# Patient Record
Sex: Female | Born: 1975 | Race: White | Hispanic: No | Smoking: Former smoker
Health system: Southern US, Community
[De-identification: ages and names within clinical notes are randomized; demographics above are authoritative.]

## PROBLEM LIST (undated history)

## (undated) DIAGNOSIS — G43909 Migraine, unspecified, not intractable, without status migrainosus: Secondary | ICD-10-CM

## (undated) DIAGNOSIS — I10 Essential (primary) hypertension: Secondary | ICD-10-CM

## (undated) DIAGNOSIS — N959 Unspecified menopausal and perimenopausal disorder: Secondary | ICD-10-CM

## (undated) HISTORY — DX: Unspecified menopausal and perimenopausal disorder: N95.9

## (undated) HISTORY — DX: Essential (primary) hypertension: I10

## (undated) HISTORY — DX: Migraine, unspecified, not intractable, without status migrainosus: G43.909

---

## 2006-06-07 ENCOUNTER — Emergency Department: Payer: Self-pay | Admitting: Emergency Medicine

## 2007-04-14 ENCOUNTER — Ambulatory Visit: Payer: Self-pay | Admitting: Internal Medicine

## 2008-03-22 ENCOUNTER — Observation Stay: Payer: Self-pay

## 2008-03-27 ENCOUNTER — Inpatient Hospital Stay: Payer: Self-pay | Admitting: Obstetrics and Gynecology

## 2008-11-16 ENCOUNTER — Ambulatory Visit: Payer: Self-pay | Admitting: Internal Medicine

## 2008-11-29 ENCOUNTER — Ambulatory Visit: Payer: Self-pay | Admitting: Internal Medicine

## 2010-09-08 ENCOUNTER — Ambulatory Visit: Payer: Self-pay | Admitting: Internal Medicine

## 2010-11-02 ENCOUNTER — Ambulatory Visit: Payer: Self-pay | Admitting: Obstetrics and Gynecology

## 2011-06-04 ENCOUNTER — Ambulatory Visit: Payer: Self-pay

## 2011-06-04 LAB — HCG, QUANTITATIVE, PREGNANCY: Beta Hcg, Quant.: <1 m[IU]/mL — ABNORMAL LOW

## 2016-01-24 ENCOUNTER — Other Ambulatory Visit: Payer: Self-pay | Admitting: Nurse Practitioner

## 2016-01-24 DIAGNOSIS — E229 Hyperfunction of pituitary gland, unspecified: Secondary | ICD-10-CM

## 2016-01-25 ENCOUNTER — Ambulatory Visit
Admission: RE | Admit: 2016-01-25 | Discharge: 2016-01-25 | Disposition: A | Discharge status: Home / Self Care | Payer: 59 | Source: Ambulatory Visit | Attending: Nurse Practitioner | Admitting: Nurse Practitioner

## 2016-01-25 ENCOUNTER — Encounter: Payer: Self-pay | Admitting: Radiology

## 2016-01-25 DIAGNOSIS — J320 Chronic maxillary sinusitis: Secondary | ICD-10-CM | POA: Insufficient documentation

## 2016-01-25 DIAGNOSIS — E229 Hyperfunction of pituitary gland, unspecified: Secondary | ICD-10-CM | POA: Insufficient documentation

## 2016-01-25 MED ORDER — GADOBENATE DIMEGLUMINE 529 MG/ML IV SOLN
10.0000 mL | Freq: Once | INTRAVENOUS | Status: AC | PRN
  Administered 2016-01-25: 10 mL via INTRAVENOUS

## 2017-04-30 ENCOUNTER — Other Ambulatory Visit: Payer: Self-pay

## 2017-04-30 DIAGNOSIS — D352 Benign neoplasm of pituitary gland: Secondary | ICD-10-CM | POA: Insufficient documentation

## 2017-04-30 MED ORDER — ELETRIPTAN HYDROBROMIDE 40 MG PO TABS: ORAL_TABLET | ORAL | 2 refills | Status: DC

## 2017-08-22 ENCOUNTER — Ambulatory Visit: Payer: Managed Care, Other (non HMO) | Admitting: Internal Medicine

## 2017-08-22 ENCOUNTER — Encounter: Payer: Self-pay | Admitting: Internal Medicine

## 2017-08-22 VITALS — BP 128/82 | HR 99 | Temp 98.3°F | Resp 16 | Ht 65.0 in | Wt 219.6 lb

## 2017-08-22 DIAGNOSIS — J45909 Unspecified asthma, uncomplicated: Secondary | ICD-10-CM

## 2017-08-22 MED ORDER — HYDROCOD POLST-CPM POLST ER 10-8 MG/5ML PO SUER: 5.0000 mL | Freq: Two times a day (BID) | ORAL | 0 refills | Status: DC | PRN

## 2017-08-22 MED ORDER — AMOXICILLIN-POT CLAVULANATE 875-125 MG PO TABS: 1.0000 | ORAL_TABLET | Freq: Two times a day (BID) | ORAL | 0 refills | Status: DC

## 2017-08-22 NOTE — Progress Notes (Signed)
  [facility] [place], [place] [address]  Internal MEDICINE  Office Visit Note  Patient Name: Barbara Stephens  [id number]  [id number]  Date of Service: 08/26/2017  Chief Complaint  Patient presents with  . Sinusitis  . Cough  . Laryngitis     HPI Pt is here for a sick visit. Pt is unable to talk due to hoarseness. She is unable to rest at night due to cough. Postnasal drip is present    Current Medication:  Outpatient Encounter Medications as of 08/22/2017  Medication Sig  . eletriptan (RELPAX) 40 MG tablet Take 1 tab at onset of migraine May repeat in 2 hours if headache persists or recurs.  . SUMAtriptan (IMITREX) 100 MG tablet Take by mouth.  Marland Kitchen amoxicillin-clavulanate (AUGMENTIN) 875-125 MG tablet Take 1 tablet by mouth 2 (two) times daily.  . chlorpheniramine-HYDROcodone (TUSSIONEX PENNKINETIC ER) 10-8 MG/5ML SUER Take 5 mLs by mouth every 12 (twelve) hours as needed for cough.   No facility-administered encounter medications on file as of 08/22/2017.     Medical History: Past Medical History:  Diagnosis Date  . Migraines     Vital Signs: BP 128/82   Pulse 99   Temp 98.3 F (36.8 C) (Oral)   Resp 16   Ht 5\' 5"  (1.651 m)   Wt 219 lb 9.6 oz (99.6 kg)   SpO2 98%   BMI 36.54 kg/m    Review of Systems  Constitutional: Positive for chills.  HENT: Positive for congestion, postnasal drip and sinus pressure.   Eyes: Negative.   Respiratory: Positive for cough and shortness of breath.   Cardiovascular: Negative.   Psychiatric/Behavioral: Positive for sleep disturbance.    Physical Exam  Constitutional: She is oriented to person, place, and time. She appears well-developed and well-nourished. She appears distressed.  HENT:  Erythema   Pulmonary/Chest: Effort normal.  Neurological: She is alert and oriented to person, place, and time.  Skin: Skin is dry.   Assessment/Plan: 1. Acute asthmatic bronchitis - amoxicillin-clavulanate  (AUGMENTIN) 875-125 MG tablet; Take 1 tablet by mouth 2 (two) times daily.  Dispense: 20 tablet; Refill: 0 - chlorpheniramine-HYDROcodone (TUSSIONEX PENNKINETIC ER) 10-8 MG/5ML SUER; Take 5 mLs by mouth every 12 (twelve) hours as needed for cough.  Dispense: 140 mL; Refill: 0  General Counseling: Barbara Stephens verbalizes understanding of the findings of todays visit and agrees with plan of treatment. Rest and increase po fluids. I have discussed any further diagnostic evaluation that may be needed or ordered today. We also reviewed her medications today. she has been encouraged to call the office with any questions or concerns that should arise related to todays visit.    Meds ordered this encounter  Medications  . amoxicillin-clavulanate (AUGMENTIN) 875-125 MG tablet    Sig: Take 1 tablet by mouth 2 (two) times daily.    Dispense:  20 tablet    Refill:  0  . chlorpheniramine-HYDROcodone (TUSSIONEX PENNKINETIC ER) 10-8 MG/5ML SUER    Sig: Take 5 mLs by mouth every 12 (twelve) hours as needed for cough.    Dispense:  140 mL    Refill:  0    Time spent:15 Minutes

## 2017-09-26 ENCOUNTER — Other Ambulatory Visit: Payer: Self-pay

## 2017-09-26 MED ORDER — ELETRIPTAN HYDROBROMIDE 40 MG PO TABS: ORAL_TABLET | ORAL | 2 refills | Status: DC

## 2017-10-28 IMAGING — MR MR HEAD WO/W CM
18 of 19 series · 46 of 48 positions shown · IV contrast (multihance)
Comparison: 09/08/2010

CLINICAL DATA: Increased prolactin level.  History of microadenoma.

EXAM:
MRI HEAD WITHOUT AND WITH CONTRAST
TECHNIQUE: Multiplanar, multiecho pulse sequences of the brain and surrounding
structures were obtained without and with intravenous contrast.
CONTRAST:  10mL MULTIHANCE GADOBENATE DIMEGLUMINE 529 MG/ML IV SOLN

[Series 2: T1 · sagittal · 5.0mm · 0.45mm/px · 2 of 23 slices shown (1 of 3)]
[im 1/23]
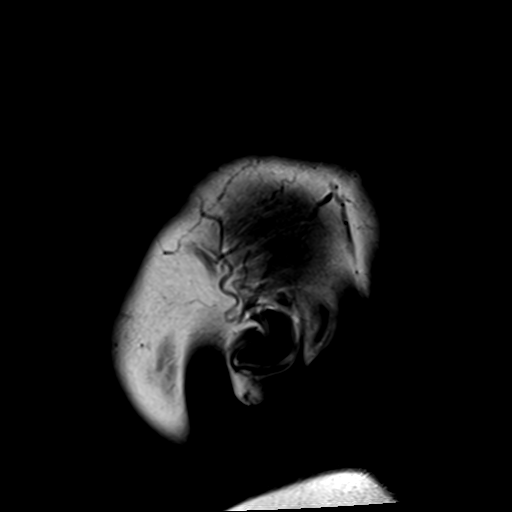
[im 23/23]
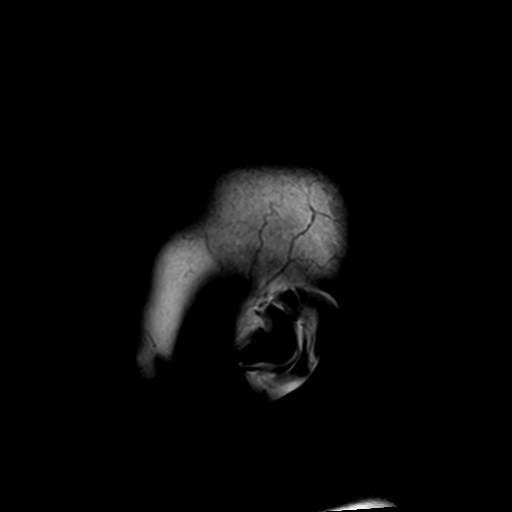

[Series 4: DWI · axial · 3.0mm · 1.20mm/px · z∈[-48,+110]mm · 6 of 55 slices shown (1 of 2)]
[im 1/55]
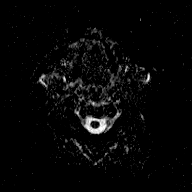
[im 11/55]
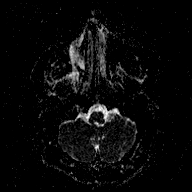
[im 22/55]
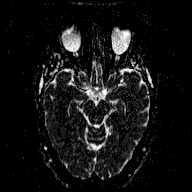
[im 33/55]
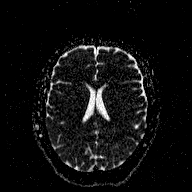
[im 44/55]
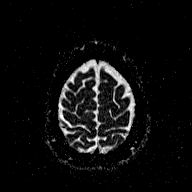
[im 55/55]
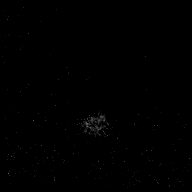

[Series 5: T2 · axial · 5.0mm · 0.90mm/px · z∈[-50,+114]mm · 3 of 29 slices shown (1 of 2)]
[im 1/29]
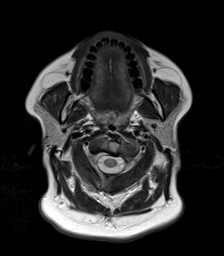
[im 15/29]
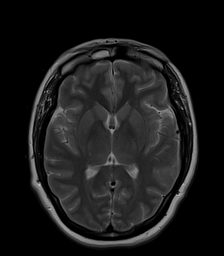
[im 29/29]
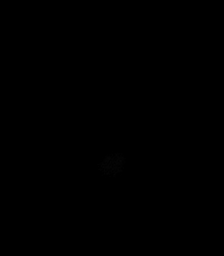

[Series 6: FLAIR · axial · 5.0mm · 0.45mm/px · z∈[-50,+114]mm · 3 of 29 slices shown]
[im 1/29]
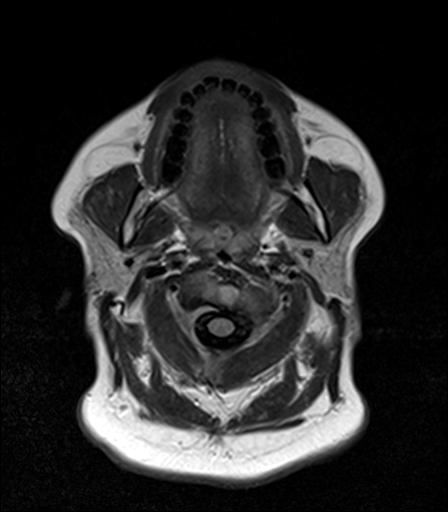
[im 15/29]
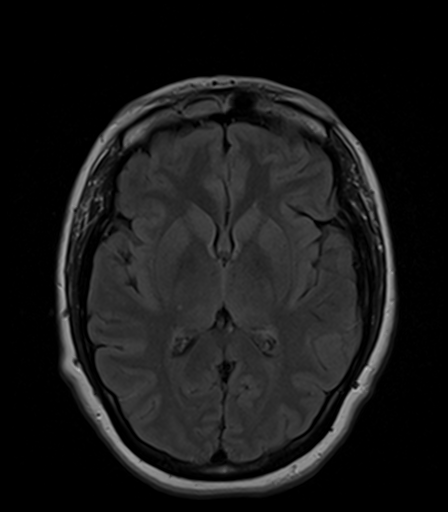
[im 29/29]
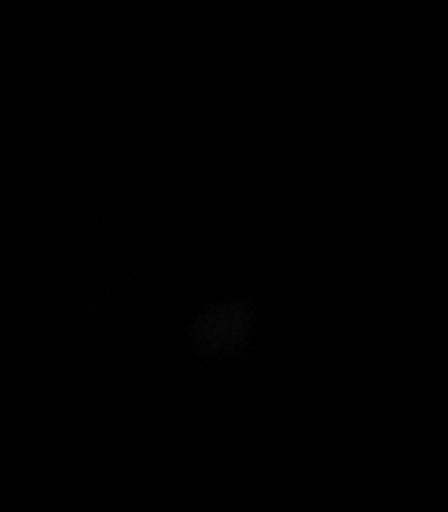

[Series 7: T2 · axial · 5.0mm · 0.72mm/px · z∈[-50,+114]mm · 3 of 29 slices shown (2 of 2)]
[im 1/29]
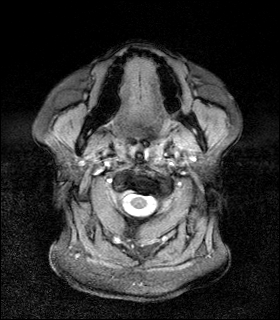
[im 15/29]
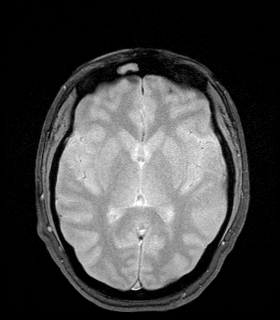
[im 29/29]
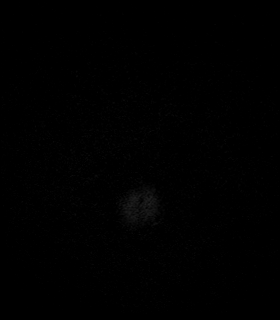

[Series 8: T1 · sagittal · 3.0mm · 0.53mm/px · 2 of 15 slices shown (2 of 3)]
[im 1/15]
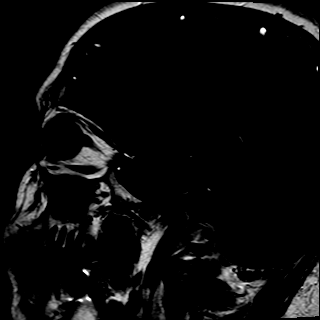
[im 15/15]
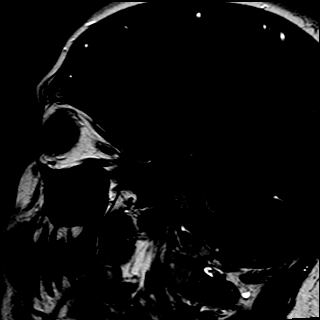

[Series 9: T1 · coronal · 3.0mm · 0.44mm/px · 1 of 17 slices shown (3 of 3)]
[im 1/17]
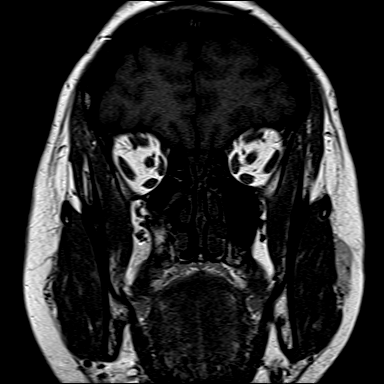

[Series 11: T1 post-contrast · coronal · 3.0mm · 0.62mm/px · 1 of 9 slices shown (1 of 10)]
[im 1/9]
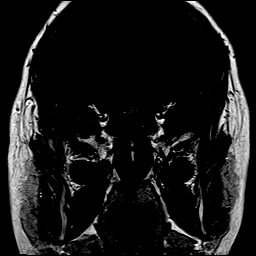

[Series 12: T1 post-contrast · coronal · 3.0mm · 0.62mm/px · 1 of 9 slices shown (2 of 10)]
[im 1/9]
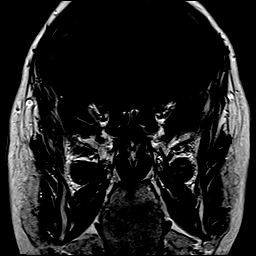

[Series 13: T1 post-contrast · coronal · 3.0mm · 0.62mm/px · 1 of 9 slices shown (3 of 10)]
[im 1/9]
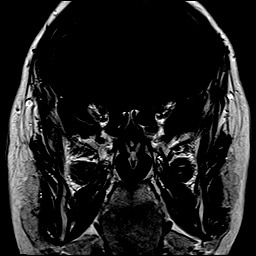

[Series 14: T1 post-contrast · coronal · 3.0mm · 0.62mm/px · 1 of 9 slices shown (4 of 10)]
[im 1/9]
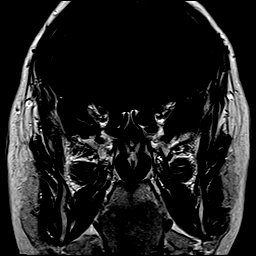

[Series 15: T1 post-contrast · coronal · 3.0mm · 0.62mm/px · 1 of 9 slices shown (5 of 10)]
[im 1/9]
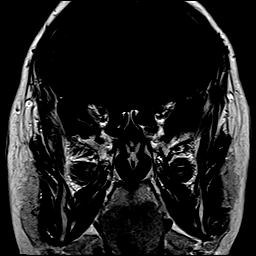

[Series 16: T1 post-contrast · coronal · 3.0mm · 0.62mm/px · 1 of 9 slices shown (6 of 10)]
[im 1/9]
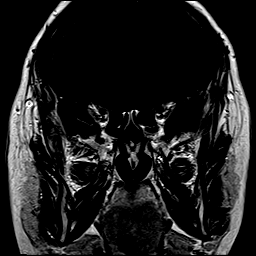

[Series 17: T1 post-contrast · sagittal · 3.0mm · 0.53mm/px · 2 of 15 slices shown (7 of 10)]
[im 1/15]
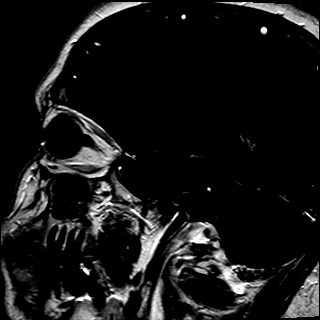
[im 15/15]
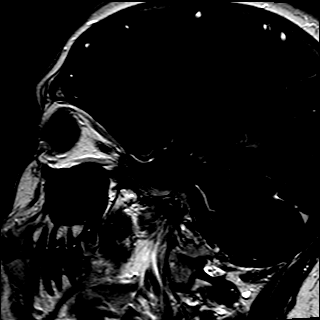

[Series 18: T1 post-contrast · coronal · 3.0mm · 0.44mm/px · 2 of 17 slices shown (8 of 10)]
[im 1/17]
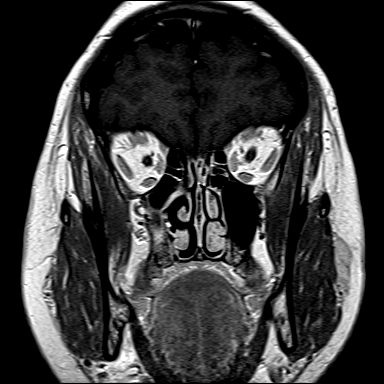
[im 17/17]
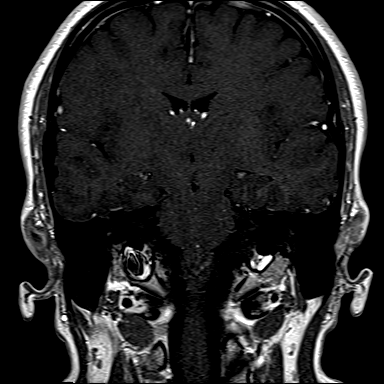

[Series 19: T1 post-contrast · axial · 3.0mm · 1.00mm/px · z∈[-46,+115]mm · 6 of 56 slices shown (9 of 10)]
[im 1/56]
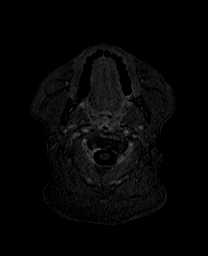
[im 12/56]
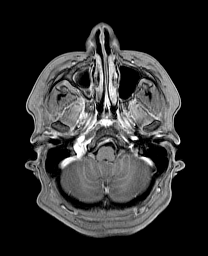
[im 23/56]
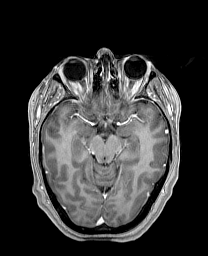
[im 34/56]
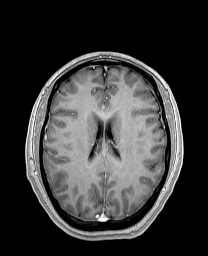
[im 45/56]
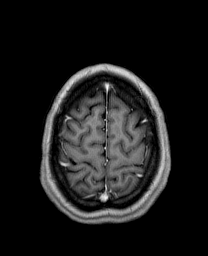
[im 56/56]
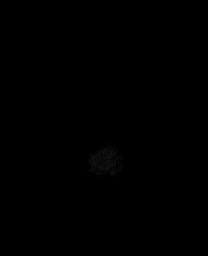

[Series 20: T1 post-contrast · coronal · 4.0mm · 0.45mm/px · 4 of 34 slices shown (10 of 10)]
[im 1/34]
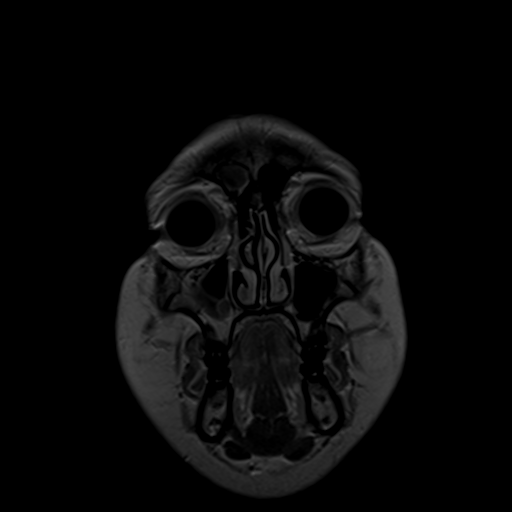
[im 12/34]
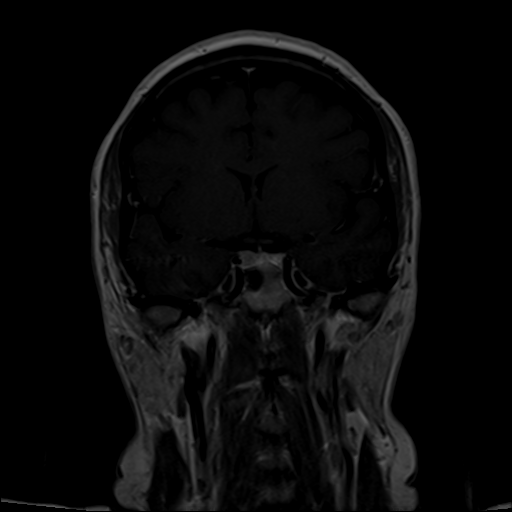
[im 23/34]
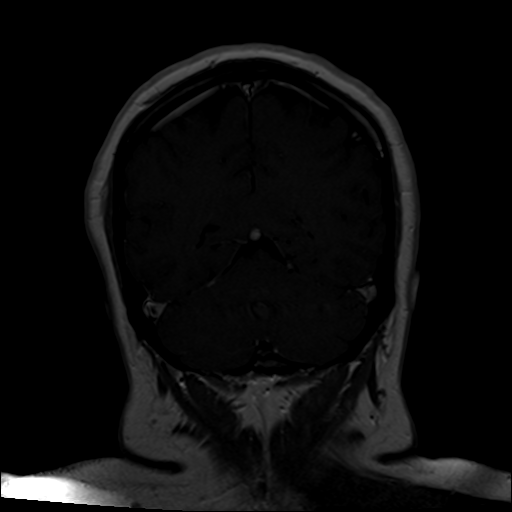
[im 34/34]
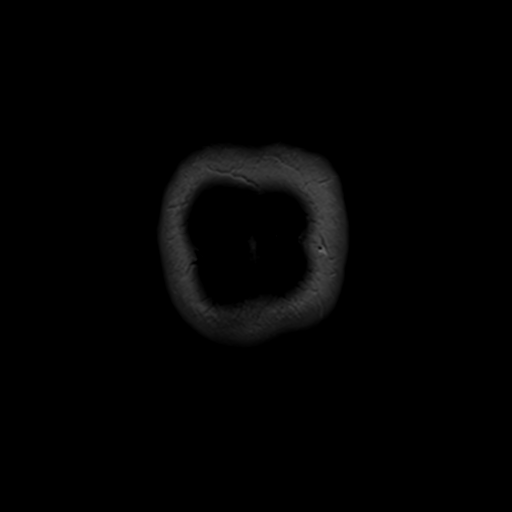

[Series 100: DWI · axial · 3.0mm · 1.20mm/px · z∈[-48,+110]mm · 6 of 54 slices shown (2 of 2)]
[im 1/54]
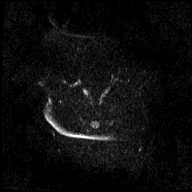
[im 11/54]
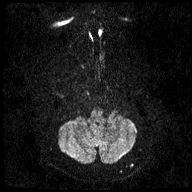
[im 22/54]
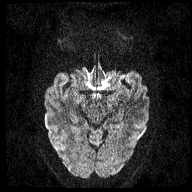
[im 32/54]
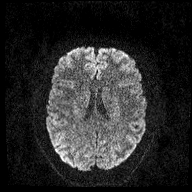
[im 43/54]
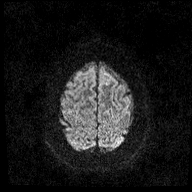
[im 54/54]
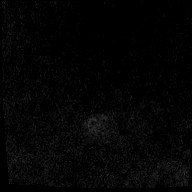

[46 of 48 positions shown; findings below may reference images not displayed]

FINDINGS: Brain: 6 mm subtle delayed enhancing area in the right sella with
superior margin convexity, stable from prior and likely a
microadenoma. Normal chiasm, cavernous sinuses, and infundibulum.

No acute or previous infarct. No hemorrhage, hydrocephalus, or
swelling.

Vascular: Normal

Skull and upper cervical spine: Negative

Sinuses/Orbits: Right maxillary and frontal sinusitis with right
maxillary fluid level.
IMPRESSION: 1. Findings remain consistent with pituitary microadenoma. No
enlargement compared to 9359; normal chiasm and cavernous sinuses.
2. Right frontal and maxillary sinusitis.

## 2017-11-05 ENCOUNTER — Encounter: Payer: Self-pay | Admitting: Adult Health

## 2017-11-05 ENCOUNTER — Ambulatory Visit: Payer: Managed Care, Other (non HMO) | Admitting: Adult Health

## 2017-11-05 VITALS — BP 130/96 | HR 90 | Resp 16 | Ht 66.0 in | Wt 226.6 lb

## 2017-11-05 DIAGNOSIS — N3000 Acute cystitis without hematuria: Secondary | ICD-10-CM | POA: Diagnosis not present

## 2017-11-05 DIAGNOSIS — R300 Dysuria: Secondary | ICD-10-CM

## 2017-11-05 LAB — POCT URINALYSIS DIPSTICK
Bilirubin, UA: NEGATIVE
Glucose, UA: NEGATIVE
Ketones, UA: NEGATIVE
Nitrite, UA: NEGATIVE
Protein, UA: NEGATIVE
RBC UA: NEGATIVE
SPEC GRAV UA: 1.015 (ref 1.010–1.025)
Urobilinogen, UA: 0.2 E.U./dL
pH, UA: 5 (ref 5.0–8.0)

## 2017-11-05 MED ORDER — SULFAMETHOXAZOLE-TRIMETHOPRIM 400-80 MG PO TABS: 1.0000 | ORAL_TABLET | Freq: Two times a day (BID) | ORAL | 1 refills | Status: AC

## 2017-11-05 MED ORDER — PHENAZOPYRIDINE HCL 100 MG PO TABS: 100.0000 mg | ORAL_TABLET | Freq: Three times a day (TID) | ORAL | 0 refills | Status: DC | PRN

## 2017-11-05 NOTE — Progress Notes (Signed)
[facility] [place], [place] [address]  Internal MEDICINE  Office Visit Note  Patient Name: Barbara Stephens  [id number]  [id number]  Date of Service: 11/05/2017  Chief Complaint  Patient presents with  . Urinary Tract Infection    frequent urination noticed yesterday morning. pt took AZO and symptoms still exist. burning. pt was swimming the weekend and noticed the symptoms     Urinary Tract Infection   This is a recurrent problem. The current episode started in the past 7 days. The problem occurs every urination. The problem has been unchanged. The quality of the pain is described as burning. The pain is mild. There has been no fever. There is no history of pyelonephritis. Pertinent negatives include no chills, frequency, nausea or vomiting.   Pt is here for a sick visit.     Current Medication:  Outpatient Encounter Medications as of 11/05/2017  Medication Sig  . eletriptan (RELPAX) 40 MG tablet Take 1 tab at onset of migraine May repeat in 2 hours if headache persists or recurs.  . SUMAtriptan (IMITREX) 100 MG tablet Take by mouth.  Marland Kitchen amoxicillin-clavulanate (AUGMENTIN) 875-125 MG tablet Take 1 tablet by mouth 2 (two) times daily. (Patient not taking: Reported on 11/05/2017)  . chlorpheniramine-HYDROcodone (TUSSIONEX PENNKINETIC ER) 10-8 MG/5ML SUER Take 5 mLs by mouth every 12 (twelve) hours as needed for cough. (Patient not taking: Reported on 11/05/2017)   No facility-administered encounter medications on file as of 11/05/2017.       Medical History: Past Medical History:  Diagnosis Date  . Migraines      Vital Signs: BP (!) 130/96 (BP Location: Right Arm, Patient Position: Sitting, Cuff Size: Large)   Pulse 90   Resp 16   Ht 5\' 6"  (1.676 m)   Wt 226 lb 9.6 oz (102.8 kg)   SpO2 96%   BMI 36.57 kg/m    Review of Systems  Constitutional: Negative for chills, fatigue and unexpected weight change.  HENT: Negative for congestion,  rhinorrhea, sneezing and sore throat.   Eyes: Negative for photophobia, pain and redness.  Respiratory: Negative for cough, chest tightness and shortness of breath.   Cardiovascular: Negative for chest pain and palpitations.  Gastrointestinal: Negative for abdominal pain, constipation, diarrhea, nausea and vomiting.  Endocrine: Negative.   Genitourinary: Negative for dysuria and frequency.  Musculoskeletal: Negative for arthralgias, back pain, joint swelling and neck pain.  Skin: Negative for rash.  Allergic/Immunologic: Negative.   Neurological: Negative for tremors and numbness.  Hematological: Negative for adenopathy. Does not bruise/bleed easily.  Psychiatric/Behavioral: Negative for behavioral problems and sleep disturbance. The patient is not nervous/anxious.     Physical Exam  Constitutional: She is oriented to person, place, and time. She appears well-developed and well-nourished. No distress.  HENT:  Head: Normocephalic and atraumatic.  Mouth/Throat: Oropharynx is clear and moist. No oropharyngeal exudate.  Eyes: Pupils are equal, round, and reactive to light. EOM are normal.  Neck: Normal range of motion. Neck supple. No JVD present. No tracheal deviation present. No thyromegaly present.  Cardiovascular: Normal rate, regular rhythm and normal heart sounds. Exam reveals no gallop and no friction rub.  No murmur heard. Pulmonary/Chest: Effort normal and breath sounds normal. No respiratory distress. She has no wheezes. She has no rales. She exhibits no tenderness.  Abdominal: Soft. There is no tenderness. There is no guarding.  Musculoskeletal: Normal range of motion.  Lymphadenopathy:    She has no cervical adenopathy.  Neurological: She is  alert and oriented to person, place, and time. No cranial nerve deficit.  Skin: Skin is warm and dry. She is not diaphoretic.  Psychiatric: She has a normal mood and affect. Her behavior is normal. Judgment and thought content normal.   Nursing note and vitals reviewed.  Assessment/Plan: 1. Acute cystitis without hematuria - sulfamethoxazole-trimethoprim (BACTRIM,SEPTRA) 400-80 MG tablet; Take 1 tablet by mouth 2 (two) times daily for 7 days.  Dispense: 14 tablet; Refill: 1 - phenazopyridine (PYRIDIUM) 100 MG tablet; Take 1 tablet (100 mg total) by mouth 3 (three) times daily as needed for pain.  Dispense: 10 tablet; Refill: 0  2. Dysuria - POCT Urinalysis Dipstick - CULTURE, URINE COMPREHENSIVE  General Counseling: Mykenzie verbalizes understanding of the findings of todays visit and agrees with plan of treatment. I have discussed any further diagnostic evaluation that may be needed or ordered today. We also reviewed her medications today. she has been encouraged to call the office with any questions or concerns that should arise related to todays visit.   Orders Placed This Encounter  Procedures  . CULTURE, URINE COMPREHENSIVE  . POCT Urinalysis Dipstick    No orders of the defined types were placed in this encounter.   Time spent: 25 Minutes  This patient was seen by Orson Gear AGNP-C in Collaboration with Dr Lavera Guise as a part of collaborative care agreement

## 2017-11-05 NOTE — Patient Instructions (Signed)

## 2017-11-08 LAB — CULTURE, URINE COMPREHENSIVE

## 2017-11-11 ENCOUNTER — Encounter: Payer: Self-pay | Admitting: Adult Health

## 2017-11-14 ENCOUNTER — Ambulatory Visit: Payer: Managed Care, Other (non HMO) | Admitting: Nurse Practitioner

## 2017-11-14 ENCOUNTER — Encounter: Payer: Self-pay | Admitting: Nurse Practitioner

## 2017-11-14 VITALS — BP 138/93 | HR 81 | Resp 16 | Ht 66.0 in | Wt 225.2 lb

## 2017-11-14 DIAGNOSIS — G43709 Chronic migraine without aura, not intractable, without status migrainosus: Secondary | ICD-10-CM | POA: Diagnosis not present

## 2017-11-14 DIAGNOSIS — G43109 Migraine with aura, not intractable, without status migrainosus: Secondary | ICD-10-CM | POA: Insufficient documentation

## 2017-11-14 DIAGNOSIS — IMO0002 Reserved for concepts with insufficient information to code with codable children: Secondary | ICD-10-CM

## 2017-11-14 MED ORDER — ZOLMITRIPTAN 5 MG PO TABS: 5.0000 mg | ORAL_TABLET | ORAL | 0 refills | Status: DC | PRN

## 2017-11-14 MED ORDER — GALCANEZUMAB-GNLM 120 MG/ML ~~LOC~~ SOAJ: 120.0000 mg | SUBCUTANEOUS | 5 refills | Status: DC

## 2017-11-14 NOTE — Progress Notes (Signed)
[facility] [place], [place] [address]  Internal MEDICINE  Office Visit Note  Patient Name: Barbara Stephens  [id number]  [id number]  Date of Service: 11/14/2017   Chief Complaint  Patient presents with  . Migraine    chronic migraine for about 9 days     The patient states that she has had a migraine every day for the past 9 days. Has run out of abortive therapy but does not feel like relpax is not really helping. She has been on topamax and propranolol to help reduce the frequency and severity of her headaches. topamax increased her headaches and propranolol dropped her blood pressure so much she was dizzy and nearly passed out.   Migraine   This is a chronic problem. The current episode started more than 1 year ago. The problem occurs daily. The problem has been gradually worsening. The pain is located in the left unilateral region. The pain does not radiate. The pain quality is similar to prior headaches. The pain is at a severity of 6/10. Associated symptoms include phonophobia and photophobia. Pertinent negatives include no abdominal pain, back pain, coughing, eye pain, eye redness, nausea, neck pain, numbness, rhinorrhea, sore throat or vomiting. The symptoms are aggravated by noise and activity (stress). She has tried acetaminophen, beta blockers, darkened room, ergotamines, triptans and NSAIDs for the symptoms. The treatment provided moderate relief. Her past medical history is significant for migraine headaches.        Current Medication:  Outpatient Encounter Medications as of 11/14/2017  Medication Sig Note  . [DISCONTINUED] eletriptan (RELPAX) 40 MG tablet Take 1 tab at onset of migraine May repeat in 2 hours if headache persists or recurs. 11/14/2017: no longer effective  . [DISCONTINUED] phenazopyridine (PYRIDIUM) 100 MG tablet Take 1 tablet (100 mg total) by mouth 3 (three) times daily as needed for pain.   . [DISCONTINUED] SUMAtriptan (IMITREX)  100 MG tablet Take by mouth.   . Galcanezumab-gnlm (EMGALITY) 120 MG/ML SOAJ Inject 120 mg into the skin every 30 (thirty) days.   Marland Kitchen zolmitriptan (ZOMIG) 5 MG tablet Take 1 tablet (5 mg total) by mouth as needed for migraine.   . [DISCONTINUED] amoxicillin-clavulanate (AUGMENTIN) 875-125 MG tablet Take 1 tablet by mouth 2 (two) times daily. (Patient not taking: Reported on 11/05/2017)   . [DISCONTINUED] chlorpheniramine-HYDROcodone (TUSSIONEX PENNKINETIC ER) 10-8 MG/5ML SUER Take 5 mLs by mouth every 12 (twelve) hours as needed for cough. (Patient not taking: Reported on 11/05/2017)    No facility-administered encounter medications on file as of 11/14/2017.       Medical History: Past Medical History:  Diagnosis Date  . Migraines      Vital Signs: BP (!) 138/93 (BP Location: Right Arm, Patient Position: Sitting, Cuff Size: Large)   Pulse 81   Resp 16   Ht 5\' 6"  (1.676 m)   Wt 225 lb 3.2 oz (102.2 kg)   SpO2 97%   BMI 36.35 kg/m    Review of Systems  Constitutional: Negative for activity change, chills, fatigue and unexpected weight change.  HENT: Negative for congestion, postnasal drip, rhinorrhea, sneezing and sore throat.   Eyes: Positive for photophobia. Negative for pain, discharge, redness, itching and visual disturbance.  Respiratory: Negative for cough, chest tightness, shortness of breath and wheezing.   Cardiovascular: Negative for chest pain and palpitations.       Bp elevated today  Gastrointestinal: Negative for abdominal pain, constipation, diarrhea, nausea and vomiting.  Endocrine: Negative for cold  intolerance, heat intolerance, polydipsia, polyphagia and polyuria.  Genitourinary: Negative.  Negative for dysuria and frequency.  Musculoskeletal: Negative for arthralgias, back pain, joint swelling and neck pain.  Skin: Negative for rash.  Allergic/Immunologic: Positive for environmental allergies.  Neurological: Positive for headaches. Negative for tremors and  numbness.       Nearly daily migraine headaches   Hematological: Negative for adenopathy. Does not bruise/bleed easily.  Psychiatric/Behavioral: Negative for behavioral problems (Depression), sleep disturbance and suicidal ideas. The patient is nervous/anxious.     Physical Exam  Constitutional: She is oriented to person, place, and time. She appears well-developed and well-nourished. No distress.  HENT:  Head: Normocephalic and atraumatic.  Nose: Nose normal.  Mouth/Throat: Oropharynx is clear and moist. No oropharyngeal exudate.  Eyes: Pupils are equal, round, and reactive to light. Conjunctivae and EOM are normal.  Neck: Normal range of motion. Neck supple. No JVD present. No tracheal deviation present. No thyromegaly present.  Cardiovascular: Normal rate, regular rhythm and normal heart sounds. Exam reveals no gallop and no friction rub.  No murmur heard. Pulmonary/Chest: Effort normal and breath sounds normal. No respiratory distress. She has no wheezes. She has no rales. She exhibits no tenderness.  Abdominal: Soft. Bowel sounds are normal. There is no tenderness.  Musculoskeletal: Normal range of motion.  Lymphadenopathy:    She has no cervical adenopathy.  Neurological: She is alert and oriented to person, place, and time. No cranial nerve deficit.  Skin: Skin is warm and dry. She is not diaphoretic.  Psychiatric: She has a normal mood and affect. Her behavior is normal. Judgment and thought content normal.  Nursing note and vitals reviewed.  Assessment/Plan: 1. Chronic migraine Has failed trials of topamax and prppranolol. Will add emgality to prevent migraine headaches. Loading dose instructions and sample provided to patient. Change relpax to zomig 5mg  to use as needed for acute headaches.  - zolmitriptan (ZOMIG) 5 MG tablet; Take 1 tablet (5 mg total) by mouth as needed for migraine.  Dispense: 10 tablet; Refill: 0 - Galcanezumab-gnlm (EMGALITY) 120 MG/ML SOAJ; Inject 120 mg  into the skin every 30 (thirty) days.  Dispense: 1 pen; Refill: 5  General Counseling: Meghin verbalizes understanding of the findings of todays visit and agrees with plan of treatment. I have discussed any further diagnostic evaluation that may be needed or ordered today. We also reviewed her medications today. she has been encouraged to call the office with any questions or concerns that should arise related to todays visit.    Counseling:  This patient was seen by [place] with Dr Lavera Guise as a part of collaborative care agreement  Meds ordered this encounter  Medications  . zolmitriptan (ZOMIG) 5 MG tablet    Sig: Take 1 tablet (5 mg total) by mouth as needed for migraine.    Dispense:  10 tablet    Refill:  0    Patient has failed imitrex and relpax    Order Specific Question:   Supervising Provider    Answer:   Lavera Guise [[id number]]  . Galcanezumab-gnlm (EMGALITY) 120 MG/ML SOAJ    Sig: Inject 120 mg into the skin every 30 (thirty) days.    Dispense:  1 pen    Refill:  5    Sample provided 11/14/2017    Order Specific Question:   Supervising Provider    Answer:   Lavera Guise [[id number]]    Time spent: 15 Minutes

## 2017-12-05 ENCOUNTER — Ambulatory Visit: Payer: Managed Care, Other (non HMO) | Admitting: Nurse Practitioner

## 2017-12-05 ENCOUNTER — Encounter: Payer: Self-pay | Admitting: Nurse Practitioner

## 2017-12-05 VITALS — BP 133/86 | HR 98 | Resp 16 | Ht 66.0 in | Wt 226.0 lb

## 2017-12-05 DIAGNOSIS — G43709 Chronic migraine without aura, not intractable, without status migrainosus: Secondary | ICD-10-CM

## 2017-12-05 DIAGNOSIS — E668 Other obesity: Secondary | ICD-10-CM | POA: Diagnosis not present

## 2017-12-05 DIAGNOSIS — IMO0002 Reserved for concepts with insufficient information to code with codable children: Secondary | ICD-10-CM

## 2017-12-05 MED ORDER — GALCANEZUMAB-GNLM 120 MG/ML ~~LOC~~ SOAJ: 120.0000 mg | SUBCUTANEOUS | 5 refills | Status: DC

## 2017-12-05 MED ORDER — PHENTERMINE HCL 37.5 MG PO TABS: 37.5000 mg | ORAL_TABLET | Freq: Every day | ORAL | 1 refills | Status: DC

## 2017-12-05 NOTE — Progress Notes (Signed)
[facility] [place], [place] [address]  Internal MEDICINE  Office Visit Note  Patient Name: Barbara Stephens  [id number]  [id number]  Date of Service: 12/05/2017  Chief Complaint  Patient presents with  . Migraine    medication change 3wk follow up    The patient states that she was having migraines every day. Was waking up with them, would need to put ice pack on her head as soon as she woke up to calm down the headaches. She has been on topamax and propranolol to help reduce the frequency and severity of her headaches. topamax increased her headaches and propranolol dropped her blood pressure so much she was dizzy and nearly passed out. Started her on Emgality at her last visit to reduce severity and frequency of migraines. She states that she has had once headache since she started this medication and this was more due to heat rather than migraine. She has not had to use abortive therapy at all since she started on the [facility].  She recently had her biometric screening at work. Did not satisfy bmi requirements. Nees to have waiver filled out to prevent her insurance premiums from going up.   Migraine   This is a chronic problem. The current episode started more than 1 year ago. The problem occurs daily. The problem has been gradually worsening. The pain is located in the left unilateral region. The pain does not radiate. The pain quality is similar to prior headaches. The pain is at a severity of 6/10. Associated symptoms include phonophobia and photophobia. Pertinent negatives include no abdominal pain, back pain, coughing, eye pain, eye redness, nausea, neck pain, numbness, rhinorrhea, sore throat or vomiting. The symptoms are aggravated by noise and activity (stress). She has tried acetaminophen, beta blockers, darkened room, ergotamines, triptans and NSAIDs for the symptoms. The treatment provided moderate relief. Her past medical history is significant for  migraine headaches.       Current Medication: Outpatient Encounter Medications as of 12/05/2017  Medication Sig  . Galcanezumab-gnlm (EMGALITY) 120 MG/ML SOAJ Inject 120 mg into the skin every 30 (thirty) days.  Marland Kitchen zolmitriptan (ZOMIG) 5 MG tablet Take 1 tablet (5 mg total) by mouth as needed for migraine.  . [DISCONTINUED] Galcanezumab-gnlm (EMGALITY) 120 MG/ML SOAJ Inject 120 mg into the skin every 30 (thirty) days.  . phentermine (ADIPEX-P) 37.5 MG tablet Take 1 tablet (37.5 mg total) by mouth daily before breakfast.   No facility-administered encounter medications on file as of 12/05/2017.     Surgical History: Past Surgical History:  Procedure Laterality Date  . CESAREAN SECTION      Medical History: Past Medical History:  Diagnosis Date  . Migraines     Family History: Family History  Problem Relation Age of Onset  . Thyroid disease Mother   . Lung cancer Father   . Diabetes Father     Social History   Socioeconomic History  . Marital status: Legally Separated    Spouse name: Not on file  . Number of children: Not on file  . Years of education: Not on file  . Highest education level: Not on file  Occupational History  . Not on file  Social Needs  . Financial resource strain: Not on file  . Food insecurity:    Worry: Not on file    Inability: Not on file  . Transportation needs:    Medical: Not on file    Non-medical: Not on file  Tobacco  Use  . Smoking status: Former Smoker    Types: Cigarettes  . Smokeless tobacco: Never Used  Substance and Sexual Activity  . Alcohol use: Yes    Frequency: Never    Comment: occasionally  . Drug use: Never  . Sexual activity: Not on file  Lifestyle  . Physical activity:    Days per week: Not on file    Minutes per session: Not on file  . Stress: Not on file  Relationships  . Social connections:    Talks on phone: Not on file    Gets together: Not on file    Attends religious service: Not on file    Active  member of club or organization: Not on file    Attends meetings of clubs or organizations: Not on file    Relationship status: Not on file  . Intimate partner violence:    Fear of current or ex partner: Not on file    Emotionally abused: Not on file    Physically abused: Not on file    Forced sexual activity: Not on file  Other Topics Concern  . Not on file  Social History Narrative  . Not on file      Review of Systems  Constitutional: Negative for activity change, chills, fatigue and unexpected weight change.  HENT: Negative for congestion, postnasal drip, rhinorrhea, sneezing and sore throat.   Eyes: Positive for photophobia. Negative for pain, discharge, redness, itching and visual disturbance.  Respiratory: Negative for cough, chest tightness, shortness of breath and wheezing.   Cardiovascular: Negative for chest pain and palpitations.  Gastrointestinal: Negative for abdominal pain, constipation, diarrhea, nausea and vomiting.  Endocrine: Negative for cold intolerance, heat intolerance, polydipsia, polyphagia and polyuria.  Genitourinary: Negative.  Negative for dysuria and frequency.  Musculoskeletal: Negative for arthralgias, back pain, joint swelling and neck pain.  Skin: Negative for rash.  Allergic/Immunologic: Positive for environmental allergies.  Neurological: Positive for headaches. Negative for tremors and numbness.       Has not had to use medication for acute migraines since starting emgality.   Hematological: Negative for adenopathy. Does not bruise/bleed easily.  Psychiatric/Behavioral: Negative for behavioral problems (Depression), sleep disturbance and suicidal ideas. The patient is nervous/anxious.     Vital Signs: BP 133/86 (BP Location: Right Arm, Patient Position: Sitting, Cuff Size: Large)   Pulse 98   Resp 16   Ht 5\' 6"  (1.676 m)   Wt 226 lb (102.5 kg)   SpO2 97%   BMI 36.48 kg/m    Physical Exam  Constitutional: She is oriented to person,  place, and time. She appears well-developed and well-nourished. No distress.  HENT:  Head: Normocephalic and atraumatic.  Nose: Nose normal.  Mouth/Throat: Oropharynx is clear and moist. No oropharyngeal exudate.  Eyes: Pupils are equal, round, and reactive to light. Conjunctivae and EOM are normal.  Neck: Normal range of motion. Neck supple. No JVD present. No tracheal deviation present. No thyromegaly present.  Cardiovascular: Normal rate, regular rhythm and normal heart sounds. Exam reveals no gallop and no friction rub.  No murmur heard. Pulmonary/Chest: Effort normal and breath sounds normal. No respiratory distress. She has no wheezes. She has no rales. She exhibits no tenderness.  Abdominal: Soft. Bowel sounds are normal. There is no tenderness.  Musculoskeletal: Normal range of motion.  Lymphadenopathy:    She has no cervical adenopathy.  Neurological: She is alert and oriented to person, place, and time. No cranial nerve deficit.  Skin: Skin is warm  and dry. She is not diaphoretic.  Psychiatric: Her behavior is normal. Judgment and thought content normal. Her mood appears anxious. Cognition and memory are normal.  Nursing note and vitals reviewed.  Assessment/Plan: 1. Chronic migraine Improved. Will continue emgality 120mg  [place] monthly. New rx sent to her pharmacy. May use abortive therapy as needed and as prescribed.  - Galcanezumab-gnlm (EMGALITY) 120 MG/ML SOAJ; Inject 120 mg into the skin every 30 (thirty) days.  Dispense: 1 pen; Refill: 5  2. Moderate obesity Restart phentermine 37.5mg  tablets daily. Obesity Counseling: Risk Assessment: An assessment of behavioral risk factors was made today and includes lack of exercise sedentary lifestyle, lack of portion control and poor dietary habits. Risk Modification Advice: She was counseled on portion control guidelines. Restricting daily caloric intake to 1500. The detrimental long term effects of obesity on her health and ongoing  poor compliance was also discussed with the patient. - phentermine (ADIPEX-P) 37.5 MG tablet; Take 1 tablet (37.5 mg total) by mouth daily before breakfast.  Dispense: 30 tablet; Refill: 13  General Counseling: Kalyn verbalizes understanding of the findings of todays visit and agrees with plan of treatment. I have discussed any further diagnostic evaluation that may be needed or ordered today. We also reviewed her medications today. she has been encouraged to call the office with any questions or concerns that should arise related to todays visit.   There is a liability release in patients' chart. There has been a 10 minute discussion about the side effects including but not limited to elevated blood pressure, anxiety, lack of sleep and dry mouth. Pt understands and will like to start/continue on appetite suppressant at this time. There will be one month RX given at the time of visit with proper follow up. Nova diet plan with restricted calories is given to the pt. Pt understands and agrees with  plan of treatment  This patient was seen by Leretha Pol FNP Collaboration with Dr Lavera Guise as a part of collaborative care agreement    Meds ordered this encounter  Medications  . Galcanezumab-gnlm (EMGALITY) 120 MG/ML SOAJ    Sig: Inject 120 mg into the skin every 30 (thirty) days.    Dispense:  1 pen    Refill:  5    The patient has copay savings card.    Order Specific Question:   Supervising Provider    Answer:   Lavera Guise [[id number]]  . phentermine (ADIPEX-P) 37.5 MG tablet    Sig: Take 1 tablet (37.5 mg total) by mouth daily before breakfast.    Dispense:  30 tablet    Refill:  1    Order Specific Question:   Supervising Provider    Answer:   Lavera Guise [[id number]]    Time spent: 6 Minutes      Dr Lavera Guise Internal medicine

## 2017-12-30 ENCOUNTER — Telehealth: Payer: Self-pay | Admitting: Nurse Practitioner

## 2017-12-30 NOTE — Telephone Encounter (Signed)
Emgality was denied by insurance

## 2018-01-17 ENCOUNTER — Ambulatory Visit: Payer: Self-pay | Admitting: Nurse Practitioner

## 2018-01-22 ENCOUNTER — Encounter: Payer: Self-pay | Admitting: Adult Health

## 2018-01-22 ENCOUNTER — Ambulatory Visit: Payer: Managed Care, Other (non HMO) | Admitting: Adult Health

## 2018-01-22 VITALS — BP 148/92 | HR 98 | Temp 98.2°F | Resp 16 | Ht 66.0 in | Wt 220.0 lb

## 2018-01-22 DIAGNOSIS — R21 Rash and other nonspecific skin eruption: Secondary | ICD-10-CM

## 2018-01-22 DIAGNOSIS — J029 Acute pharyngitis, unspecified: Secondary | ICD-10-CM | POA: Diagnosis not present

## 2018-01-22 DIAGNOSIS — J020 Streptococcal pharyngitis: Secondary | ICD-10-CM | POA: Diagnosis not present

## 2018-01-22 LAB — POCT RAPID STREP A (OFFICE): RAPID STREP A SCREEN: NEGATIVE

## 2018-01-22 MED ORDER — PENICILLIN V POTASSIUM 500 MG PO TABS: 500.0000 mg | ORAL_TABLET | Freq: Three times a day (TID) | ORAL | 0 refills | Status: DC

## 2018-01-22 NOTE — Patient Instructions (Signed)

## 2018-01-22 NOTE — Progress Notes (Signed)
[facility] [place], [place] [address]  Internal MEDICINE  Office Visit Note  Patient Name: Barbara Stephens  [id number]  [id number]  Date of Service: 01/22/2018  Chief Complaint  Patient presents with  . Sore Throat  . Rash    red , spotchy places around neck      HPI Pt is here for a sick visit.  Patient reports 4 days ago she began to notice a rash on her neck and chest.  The rash is not itchy and does not hurt.  She attributed this to stress at the time since she was traveling with her daughter for dance competition.  However yesterday she woke up with a sore throat, and just was not feeling well.  She reports that her throat feels scratchy and burning.  She is concerned that she may have strep.  Denies any sick contacts.     Current Medication:  Outpatient Encounter Medications as of 01/22/2018  Medication Sig  . Galcanezumab-gnlm (EMGALITY) 120 MG/ML SOAJ Inject 120 mg into the skin every 30 (thirty) days.  . phentermine (ADIPEX-P) 37.5 MG tablet Take 1 tablet (37.5 mg total) by mouth daily before breakfast.  . zolmitriptan (ZOMIG) 5 MG tablet Take 1 tablet (5 mg total) by mouth as needed for migraine.  . penicillin v potassium (VEETID) 500 MG tablet Take 1 tablet (500 mg total) by mouth 3 (three) times daily.   No facility-administered encounter medications on file as of 01/22/2018.       Medical History: Past Medical History:  Diagnosis Date  . Migraines      Vital Signs: BP (!) 148/92   Pulse 98   Temp 98.2 F (36.8 C)   Resp 16   Ht 5\' 6"  (1.676 m)   Wt 220 lb (99.8 kg)   SpO2 97%   BMI 35.51 kg/m    Review of Systems  Constitutional: Positive for fatigue. Negative for chills, fever and unexpected weight change.  HENT: Positive for sore throat. Negative for congestion, rhinorrhea and sneezing.   Eyes: Negative for photophobia, pain and redness.  Respiratory: Negative for cough, chest tightness and shortness of breath.    Cardiovascular: Negative for chest pain and palpitations.  Gastrointestinal: Negative for abdominal pain, constipation, diarrhea, nausea and vomiting.  Endocrine: Negative.   Genitourinary: Negative for dysuria and frequency.  Musculoskeletal: Negative for arthralgias, back pain, joint swelling and neck pain.  Skin: Negative for rash.  Allergic/Immunologic: Negative.   Neurological: Negative for tremors and numbness.  Hematological: Negative for adenopathy. Does not bruise/bleed easily.  Psychiatric/Behavioral: Negative for behavioral problems and sleep disturbance. The patient is not nervous/anxious.     Physical Exam  Constitutional: She is oriented to person, place, and time. She appears well-developed and well-nourished. No distress.  HENT:  Head: Normocephalic and atraumatic.  Mouth/Throat: Posterior oropharyngeal edema present. No oropharyngeal exudate. Tonsils are 2+ on the right. Tonsils are 2+ on the left. Tonsillar exudate.  Eyes: Pupils are equal, round, and reactive to light. EOM are normal.  Neck: Normal range of motion. Neck supple. No JVD present. No tracheal deviation present. No thyromegaly present.  Cardiovascular: Normal rate, regular rhythm and normal heart sounds. Exam reveals no gallop and no friction rub.  No murmur heard. Pulmonary/Chest: Effort normal and breath sounds normal. No respiratory distress. She has no wheezes. She has no rales. She exhibits no tenderness.  Abdominal: Soft. There is no tenderness. There is no guarding.  Musculoskeletal: Normal range of motion.  Lymphadenopathy:       Head (right side): Tonsillar adenopathy present.       Head (left side): Tonsillar adenopathy present.    She has no cervical adenopathy.  Neurological: She is alert and oriented to person, place, and time. No cranial nerve deficit.  Skin: Skin is warm and dry. Rash noted. She is not diaphoretic.  Psychiatric: She has a normal mood and affect. Her behavior is normal.  Judgment and thought content normal.  Nursing note and vitals reviewed.   Assessment/Plan: 1. Strep pharyngitis Patient strep test in office negative at this time.  I am concerned because her throat is only been sore for 24 hours, and the antigen that our strep test for pain not be present yet on patient's tonsils.  She has swollen tonsillar lymph nodes.  As well as read edematous pharynx.  Also tonsils are 2+ bilaterally with minimal exudate this time.  Will treat patient with penicillin, with instructions to follow-up in office if no better in 5 days. - penicillin v potassium (VEETID) 500 MG tablet; Take 1 tablet (500 mg total) by mouth 3 (three) times daily.  Dispense: 30 tablet; Refill: 0  2. Sore throat Rapid strep negative during this visit. - POCT rapid strep A  3. Rash of neck Patient remains present on the neck and chest, patient reports this has gotten better over the last day or so however is still present.  General Counseling: Faun verbalizes understanding of the findings of todays visit and agrees with plan of treatment. I have discussed any further diagnostic evaluation that may be needed or ordered today. We also reviewed her medications today. she has been encouraged to call the office with any questions or concerns that should arise related to todays visit.   Orders Placed This Encounter  Procedures  . POCT rapid strep A    Meds ordered this encounter  Medications  . penicillin v potassium (VEETID) 500 MG tablet    Sig: Take 1 tablet (500 mg total) by mouth 3 (three) times daily.    Dispense:  30 tablet    Refill:  0    Time spent: 20 Minutes  This patient was seen by Orson Gear AGNP-C in Collaboration with Dr Lavera Guise as a part of collaborative care agreement.  [name] AGNP-C Internal Medicine

## 2018-01-31 ENCOUNTER — Ambulatory Visit: Payer: Managed Care, Other (non HMO) | Admitting: Adult Health

## 2018-01-31 ENCOUNTER — Encounter: Payer: Self-pay | Admitting: Adult Health

## 2018-01-31 VITALS — BP 126/78 | HR 122 | Resp 16 | Ht 66.0 in | Wt 222.0 lb

## 2018-01-31 DIAGNOSIS — G43709 Chronic migraine without aura, not intractable, without status migrainosus: Secondary | ICD-10-CM | POA: Diagnosis not present

## 2018-01-31 DIAGNOSIS — E668 Other obesity: Secondary | ICD-10-CM | POA: Diagnosis not present

## 2018-01-31 DIAGNOSIS — IMO0002 Reserved for concepts with insufficient information to code with codable children: Secondary | ICD-10-CM

## 2018-01-31 MED ORDER — PHENTERMINE HCL 37.5 MG PO TABS: 37.5000 mg | ORAL_TABLET | Freq: Every day | ORAL | 0 refills | Status: DC

## 2018-01-31 NOTE — Patient Instructions (Signed)

## 2018-01-31 NOTE — Progress Notes (Signed)
[facility] [place], [place] [address]  Internal MEDICINE  Office Visit Note  Patient Name: Barbara Stephens  [id number]  [id number]  Date of Service: 01/31/2018  Chief Complaint  Patient presents with  . Medical Management of Chronic Issues    weight loss management   . Migraine    HPI Patient here for follow-up on migraines weight loss management.  She reports her migraines are much improved now that she is on the infidelity.  She reports it has been at least 2 weeks and she is had a migraine.  She is currently taking phentermine for weight loss.  She is down 4 pounds since her initial visit.  She denies any chest pain, palpitations, headaches, or other side effects.  She does report she will get dry mouth if she does not drink enough water while taking. She denies any other needs at this time.    Current Medication: Outpatient Encounter Medications as of 01/31/2018  Medication Sig  . Galcanezumab-gnlm (EMGALITY) 120 MG/ML SOAJ Inject 120 mg into the skin every 30 (thirty) days.  . penicillin v potassium (VEETID) 500 MG tablet Take 1 tablet (500 mg total) by mouth 3 (three) times daily.  . phentermine (ADIPEX-P) 37.5 MG tablet Take 1 tablet (37.5 mg total) by mouth daily before breakfast.  . zolmitriptan (ZOMIG) 5 MG tablet Take 1 tablet (5 mg total) by mouth as needed for migraine.  . [DISCONTINUED] phentermine (ADIPEX-P) 37.5 MG tablet Take 1 tablet (37.5 mg total) by mouth daily before breakfast.   No facility-administered encounter medications on file as of 01/31/2018.     Surgical History: Past Surgical History:  Procedure Laterality Date  . CESAREAN SECTION      Medical History: Past Medical History:  Diagnosis Date  . Migraines     Family History: Family History  Problem Relation Age of Onset  . Thyroid disease Mother   . Lung cancer Father   . Diabetes Father     Social History   Socioeconomic History  . Marital status:  Legally Separated    Spouse name: Not on file  . Number of children: Not on file  . Years of education: Not on file  . Highest education level: Not on file  Occupational History  . Not on file  Social Needs  . Financial resource strain: Not on file  . Food insecurity:    Worry: Not on file    Inability: Not on file  . Transportation needs:    Medical: Not on file    Non-medical: Not on file  Tobacco Use  . Smoking status: Former Smoker    Types: Cigarettes  . Smokeless tobacco: Never Used  Substance and Sexual Activity  . Alcohol use: Yes    Frequency: Never    Comment: occasionally  . Drug use: Never  . Sexual activity: Not on file  Lifestyle  . Physical activity:    Days per week: Not on file    Minutes per session: Not on file  . Stress: Not on file  Relationships  . Social connections:    Talks on phone: Not on file    Gets together: Not on file    Attends religious service: Not on file    Active member of club or organization: Not on file    Attends meetings of clubs or organizations: Not on file    Relationship status: Not on file  . Intimate partner violence:    Fear of current  or ex partner: Not on file    Emotionally abused: Not on file    Physically abused: Not on file    Forced sexual activity: Not on file  Other Topics Concern  . Not on file  Social History Narrative  . Not on file      Review of Systems  Constitutional: Negative for chills, fatigue and unexpected weight change.  HENT: Negative for congestion, rhinorrhea, sneezing and sore throat.   Eyes: Negative for photophobia, pain and redness.  Respiratory: Negative for cough, chest tightness and shortness of breath.   Cardiovascular: Negative for chest pain and palpitations.  Gastrointestinal: Negative for abdominal pain, constipation, diarrhea, nausea and vomiting.  Endocrine: Negative.   Genitourinary: Negative for dysuria and frequency.  Musculoskeletal: Negative for arthralgias, back  pain, joint swelling and neck pain.  Skin: Negative for rash.  Allergic/Immunologic: Negative.   Neurological: Negative for tremors and numbness.  Hematological: Negative for adenopathy. Does not bruise/bleed easily.  Psychiatric/Behavioral: Negative for behavioral problems and sleep disturbance. The patient is not nervous/anxious.     Vital Signs: BP 126/78   Pulse (!) 122   Resp 16   Ht 5\' 6"  (1.676 m)   Wt 222 lb (100.7 kg)   SpO2 98%   BMI 35.83 kg/m    Physical Exam  Constitutional: She is oriented to person, place, and time. She appears well-developed and well-nourished. No distress.  HENT:  Head: Normocephalic and atraumatic.  Mouth/Throat: Oropharynx is clear and moist. No oropharyngeal exudate.  Eyes: Pupils are equal, round, and reactive to light. EOM are normal.  Neck: Normal range of motion. Neck supple. No JVD present. No tracheal deviation present. No thyromegaly present.  Cardiovascular: Normal rate, regular rhythm and normal heart sounds. Exam reveals no gallop and no friction rub.  No murmur heard. Pulmonary/Chest: Effort normal and breath sounds normal. No respiratory distress. She has no wheezes. She has no rales. She exhibits no tenderness.  Abdominal: Soft. There is no tenderness. There is no guarding.  Musculoskeletal: Normal range of motion.  Lymphadenopathy:    She has no cervical adenopathy.  Neurological: She is alert and oriented to person, place, and time. No cranial nerve deficit.  Skin: Skin is warm and dry. She is not diaphoretic.  Psychiatric: She has a normal mood and affect. Her behavior is normal. Judgment and thought content normal.  Nursing note and vitals reviewed.  Assessment/Plan: 1. Chronic migraine Well controlled at this time with emgality.  Continue current therapy.  2. Moderate obesity Obesity Counseling: Risk Assessment: An assessment of behavioral risk factors was made today and includes lack of exercise sedentary lifestyle,  lack of portion control and poor dietary habits.  Risk Modification Advice: She was counseled on portion control guidelines. Restricting daily caloric intake to. . The detrimental long term effects of obesity on her health and ongoing poor compliance was also discussed with the patient.  Refilled Controlled medications today. Reviewed risks and possible side effects associated with taking Stimulants. Combination of these drugs with other psychotropic medications could cause dizziness and drowsiness. Pt needs to Monitor symptoms and exercise caution in driving and operating heavy machinery to avoid damages to oneself, to others and to the surroundings. Patient verbalized understanding in this matter. Dependence and abuse for these drugs will be monitored closely. A Controlled substance policy and procedure is on file which allows [facility] to order a urine drug screen test at any visit. Patient understands and agrees with the plan.. - phentermine (  ADIPEX-P) 37.5 MG tablet; Take 1 tablet (37.5 mg total) by mouth daily before breakfast.  Dispense: 30 tablet; Refill: 0  General Counseling: Amali verbalizes understanding of the findings of todays visit and agrees with plan of treatment. I have discussed any further diagnostic evaluation that may be needed or ordered today. We also reviewed her medications today. she has been encouraged to call the office with any questions or concerns that should arise related to todays visit.    No orders of the defined types were placed in this encounter.   Meds ordered this encounter  Medications  . phentermine (ADIPEX-P) 37.5 MG tablet    Sig: Take 1 tablet (37.5 mg total) by mouth daily before breakfast.    Dispense:  30 tablet    Refill:  0    Time spent: 25 Minutes   This patient was seen by Orson Gear AGNP-C in Collaboration with Dr Lavera Guise as a part of collaborative care agreement     [name] AGNP-C Internal medicine

## 2018-02-27 ENCOUNTER — Ambulatory Visit: Payer: Self-pay | Admitting: Adult Health

## 2018-03-18 ENCOUNTER — Other Ambulatory Visit: Payer: Self-pay

## 2018-03-18 DIAGNOSIS — G43709 Chronic migraine without aura, not intractable, without status migrainosus: Secondary | ICD-10-CM

## 2018-03-18 DIAGNOSIS — IMO0002 Reserved for concepts with insufficient information to code with codable children: Secondary | ICD-10-CM

## 2018-03-18 MED ORDER — ZOLMITRIPTAN 5 MG PO TABS: 5.0000 mg | ORAL_TABLET | ORAL | 2 refills | Status: DC | PRN

## 2018-05-27 ENCOUNTER — Ambulatory Visit: Payer: Managed Care, Other (non HMO) | Admitting: Nurse Practitioner

## 2018-05-27 ENCOUNTER — Encounter: Payer: Self-pay | Admitting: Nurse Practitioner

## 2018-05-27 VITALS — BP 130/91 | HR 89 | Temp 97.6°F | Resp 16 | Ht 65.0 in | Wt 226.6 lb

## 2018-05-27 DIAGNOSIS — J069 Acute upper respiratory infection, unspecified: Secondary | ICD-10-CM | POA: Diagnosis not present

## 2018-05-27 DIAGNOSIS — R05 Cough: Secondary | ICD-10-CM

## 2018-05-27 DIAGNOSIS — R059 Cough, unspecified: Secondary | ICD-10-CM

## 2018-05-27 MED ORDER — HYDROCOD POLST-CPM POLST ER 10-8 MG/5ML PO SUER: 5.0000 mL | Freq: Two times a day (BID) | ORAL | 0 refills | Status: DC | PRN

## 2018-05-27 MED ORDER — AMOXICILLIN-POT CLAVULANATE 875-125 MG PO TABS: 1.0000 | ORAL_TABLET | Freq: Two times a day (BID) | ORAL | 0 refills | Status: DC

## 2018-05-27 NOTE — Progress Notes (Signed)
[facility] [place], [place] [address]  Internal MEDICINE  Office Visit Note  Patient Name: Barbara Stephens  [id number]  [id number]  Date of Service: 05/27/2018   Pt is here for a sick visit.  Chief Complaint  Patient presents with  . Cough    started feeling bad since friday afternoon, cough is bad during the night, has taken OTC medication and has not worked, is coughin up green sputum in the mornings, no fever or chills, pt daughter has been sick.  . Sinusitis     Sinusitis  This is a new problem. The current episode started in the past 7 days. The problem has been gradually worsening since onset. There has been no fever. Her pain is at a severity of 3/10. The pain is mild. Associated symptoms include congestion, coughing, ear pain, headaches, a hoarse voice, sinus pressure, a sore throat and swollen glands. Past treatments include acetaminophen, oral decongestants, lying down and sitting up. The treatment provided mild relief.        Current Medication:  Outpatient Encounter Medications as of 05/27/2018  Medication Sig  . Galcanezumab-gnlm (EMGALITY) 120 MG/ML SOAJ Inject 120 mg into the skin every 30 (thirty) days.  . penicillin v potassium (VEETID) 500 MG tablet Take 1 tablet (500 mg total) by mouth 3 (three) times daily.  . phentermine (ADIPEX-P) 37.5 MG tablet Take 1 tablet (37.5 mg total) by mouth daily before breakfast.  . zolmitriptan (ZOMIG) 5 MG tablet Take 1 tablet (5 mg total) by mouth as needed for migraine.  Marland Kitchen amoxicillin-clavulanate (AUGMENTIN) 875-125 MG tablet Take 1 tablet by mouth 2 (two) times daily.  . chlorpheniramine-HYDROcodone (TUSSIONEX PENNKINETIC ER) 10-8 MG/5ML SUER Take 5 mLs by mouth every 12 (twelve) hours as needed for cough.   No facility-administered encounter medications on file as of 05/27/2018.       Medical History: Past Medical History:  Diagnosis Date  . Migraines     Today's Vitals   05/27/18  1107  BP: (!) 130/91  Pulse: 89  Resp: 16  Temp: 97.6 F (36.4 C)  SpO2: 97%  Weight: 226 lb 9.6 oz (102.8 kg)  Height: 5\' 5"  (1.651 m)    Review of Systems  Constitutional: Positive for fatigue. Negative for fever.  HENT: Positive for congestion, ear pain, hoarse voice, postnasal drip, rhinorrhea, sinus pressure, sore throat and voice change.   Respiratory: Positive for cough. Negative for chest tightness and wheezing.   Cardiovascular: Negative for chest pain and palpitations.  Gastrointestinal: Negative for nausea and vomiting.  Musculoskeletal: Positive for myalgias.  Neurological: Positive for headaches.  Hematological: Positive for adenopathy.    Physical Exam Vitals signs and nursing note reviewed.  Constitutional:      General: She is not in acute distress.    Appearance: She is well-developed. She is ill-appearing. She is not diaphoretic.  HENT:     Head: Normocephalic and atraumatic.     Right Ear: Tympanic membrane is erythematous.     Left Ear: Tympanic membrane is bulging.     Nose: Congestion present.     Right Turbinates: Swollen.     Left Turbinates: Swollen.     Right Sinus: Maxillary sinus tenderness and frontal sinus tenderness present.     Left Sinus: Maxillary sinus tenderness and frontal sinus tenderness present.     Mouth/Throat:     Pharynx: Posterior oropharyngeal erythema present. No oropharyngeal exudate.     Tonsils: Swelling: 1+ on the right. 1+  on the left.  Eyes:     Pupils: Pupils are equal, round, and reactive to light.  Neck:     Musculoskeletal: Normal range of motion and neck supple.     Thyroid: No thyromegaly.     Vascular: No JVD.     Trachea: No tracheal deviation.  Cardiovascular:     Rate and Rhythm: Normal rate and regular rhythm.     Heart sounds: Normal heart sounds. No murmur. No friction rub. No gallop.   Pulmonary:     Effort: Pulmonary effort is normal. No respiratory distress.     Breath sounds: Normal breath sounds.  No wheezing or rales.     Comments: Dry, harsh cough present . Chest:     Chest wall: No tenderness.  Abdominal:     General: Bowel sounds are normal.     Palpations: Abdomen is soft.  Musculoskeletal: Normal range of motion.  Lymphadenopathy:     Cervical: Cervical adenopathy present.  Skin:    General: Skin is warm and dry.  Neurological:     Mental Status: She is alert and oriented to person, place, and time.     Cranial Nerves: No cranial nerve deficit.  Psychiatric:        Behavior: Behavior normal.        Thought Content: Thought content normal.        Judgment: Judgment normal.   Assessment/Plan: 1. Acute upper respiratory infection Start augmentin 875mg  twice daily for 10 days. Rest and increase fluids. Continue to use OTC medications totreat acute symptoms. Samples cepacol throat lozenges given. - amoxicillin-clavulanate (AUGMENTIN) 875-125 MG tablet; Take 1 tablet by mouth 2 (two) times daily.  Dispense: 20 tablet; Refill: 0  2. Cough tussionex cough suppressant may be taken twice daily as needed for cough. Advised patient not to overuse this medicine and not to mix with other medications or alcohol as it can cause respiratory distress, sleepiness or dizziness. Should also avoid driving. Patient voiced understanding and agreement.  - chlorpheniramine-HYDROcodone (TUSSIONEX PENNKINETIC ER) 10-8 MG/5ML SUER; Take 5 mLs by mouth every 12 (twelve) hours as needed for cough.  Dispense: 115 mL; Refill: 0  General Counseling: Khyla verbalizes understanding of the findings of todays visit and agrees with plan of treatment. I have discussed any further diagnostic evaluation that may be needed or ordered today. We also reviewed her medications today. she has been encouraged to call the office with any questions or concerns that should arise related to todays visit.    Counseling:   Rest and increase fluids. Continue using OTC medication to control symptoms.   This patient was  seen by [place] with Dr Lavera Guise as a part of collaborative care agreement  Meds ordered this encounter  Medications  . amoxicillin-clavulanate (AUGMENTIN) 875-125 MG tablet    Sig: Take 1 tablet by mouth 2 (two) times daily.    Dispense:  20 tablet    Refill:  0    Order Specific Question:   Supervising Provider    Answer:   Lavera Guise [[id number]]  . chlorpheniramine-HYDROcodone (TUSSIONEX PENNKINETIC ER) 10-8 MG/5ML SUER    Sig: Take 5 mLs by mouth every 12 (twelve) hours as needed for cough.    Dispense:  115 mL    Refill:  0    Order Specific Question:   Supervising Provider    Answer:   Lavera Guise [[id number]]    Time spent: 15 Minutes

## 2018-06-12 ENCOUNTER — Ambulatory Visit: Payer: Managed Care, Other (non HMO) | Admitting: Nurse Practitioner

## 2018-06-12 ENCOUNTER — Encounter: Payer: Self-pay | Admitting: Nurse Practitioner

## 2018-06-12 VITALS — BP 128/82 | HR 78 | Resp 16 | Ht 65.0 in | Wt 226.0 lb

## 2018-06-12 DIAGNOSIS — L209 Atopic dermatitis, unspecified: Secondary | ICD-10-CM | POA: Insufficient documentation

## 2018-06-12 DIAGNOSIS — E668 Other obesity: Secondary | ICD-10-CM

## 2018-06-12 DIAGNOSIS — G43709 Chronic migraine without aura, not intractable, without status migrainosus: Secondary | ICD-10-CM

## 2018-06-12 DIAGNOSIS — D352 Benign neoplasm of pituitary gland: Secondary | ICD-10-CM

## 2018-06-12 DIAGNOSIS — IMO0002 Reserved for concepts with insufficient information to code with codable children: Secondary | ICD-10-CM

## 2018-06-12 MED ORDER — TRIAMCINOLONE ACETONIDE 0.025 % EX CREA: 1.0000 "application " | TOPICAL_CREAM | Freq: Two times a day (BID) | CUTANEOUS | 2 refills | Status: DC

## 2018-06-12 MED ORDER — PHENTERMINE HCL 37.5 MG PO TABS: 37.5000 mg | ORAL_TABLET | Freq: Every day | ORAL | 0 refills | Status: DC

## 2018-06-12 MED ORDER — GALCANEZUMAB-GNLM 120 MG/ML ~~LOC~~ SOAJ: 120.0000 mg | SUBCUTANEOUS | 5 refills | Status: DC

## 2018-06-12 NOTE — Progress Notes (Signed)
[facility] [address] [place], [place] [address]  Internal MEDICINE  Office Visit Note  Patient Name: Barbara Stephens  [id number]  [id number]  Date of Service: 06/12/2018  Chief Complaint  Patient presents with  . Migraine    The patient is here for routine follow up. Doing very well with Emgality shots to prevent migraine headaches. She states that she has had only five migraine headaches since starting this. Has made a huge improvement in her life. She has history of pituitary microadenoma which effects menstrual cycles and other metabolic processes. Is having first menstrual cycle in 7 months which started today. Has never seen endocrinologist for this in the past. Her last brain MRI was ni 01/2016 and showed stable pituitary microadenoma.  Has rash on left side of her neck. Comes and goes. Not itchy. Feels like dry skin along the bottom of her neck.  Would like to go back on appetite suppressant to help with weight management. Has been off any weight loss medications for several months. Has had a 4 pound weight gain since her last routine visit .has taken phentermine in the past and done well without negative side effects .      Current Medication: Outpatient Encounter Medications as of 06/12/2018  Medication Sig  . Galcanezumab-gnlm (EMGALITY) 120 MG/ML SOAJ Inject 120 mg into the skin every 30 (thirty) days.  Marland Kitchen zolmitriptan (ZOMIG) 5 MG tablet Take 1 tablet (5 mg total) by mouth as needed for migraine.  . [DISCONTINUED] Galcanezumab-gnlm (EMGALITY) 120 MG/ML SOAJ Inject 120 mg into the skin every 30 (thirty) days.  . phentermine (ADIPEX-P) 37.5 MG tablet Take 1 tablet (37.5 mg total) by mouth daily before breakfast.  . triamcinolone (KENALOG) 0.025 % cream Apply 1 application topically 2 (two) times daily.  . [DISCONTINUED] amoxicillin-clavulanate (AUGMENTIN) 875-125 MG tablet Take 1 tablet by mouth 2 (two) times daily. (Patient not taking: Reported on 06/12/2018)  .  [DISCONTINUED] chlorpheniramine-HYDROcodone (TUSSIONEX PENNKINETIC ER) 10-8 MG/5ML SUER Take 5 mLs by mouth every 12 (twelve) hours as needed for cough. (Patient not taking: Reported on 06/12/2018)  . [DISCONTINUED] penicillin v potassium (VEETID) 500 MG tablet Take 1 tablet (500 mg total) by mouth 3 (three) times daily. (Patient not taking: Reported on 06/12/2018)  . [DISCONTINUED] phentermine (ADIPEX-P) 37.5 MG tablet Take 1 tablet (37.5 mg total) by mouth daily before breakfast. (Patient not taking: Reported on 06/12/2018)   No facility-administered encounter medications on file as of 06/12/2018.     Surgical History: Past Surgical History:  Procedure Laterality Date  . CESAREAN SECTION      Medical History: Past Medical History:  Diagnosis Date  . Migraines     Family History: Family History  Problem Relation Age of Onset  . Thyroid disease Mother   . Lung cancer Father   . Diabetes Father     Social History   Socioeconomic History  . Marital status: Legally Separated    Spouse name: Not on file  . Number of children: Not on file  . Years of education: Not on file  . Highest education level: Not on file  Occupational History  . Not on file  Social Needs  . Financial resource strain: Not on file  . Food insecurity:    Worry: Not on file    Inability: Not on file  . Transportation needs:    Medical: Not on file    Non-medical: Not on file  Tobacco Use  . Smoking status: Former Smoker  Types: Cigarettes  . Smokeless tobacco: Never Used  Substance and Sexual Activity  . Alcohol use: Yes    Frequency: Never    Comment: occasionally  . Drug use: Never  . Sexual activity: Not on file  Lifestyle  . Physical activity:    Days per week: Not on file    Minutes per session: Not on file  . Stress: Not on file  Relationships  . Social connections:    Talks on phone: Not on file    Gets together: Not on file    Attends religious service: Not on file    Active member of  club or organization: Not on file    Attends meetings of clubs or organizations: Not on file    Relationship status: Not on file  . Intimate partner violence:    Fear of current or ex partner: Not on file    Emotionally abused: Not on file    Physically abused: Not on file    Forced sexual activity: Not on file  Other Topics Concern  . Not on file  Social History Narrative  . Not on file      Review of Systems  Constitutional: Negative for chills, fatigue and unexpected weight change.       Four pound weight gain since last visit .  HENT: Negative for congestion, rhinorrhea, sneezing and sore throat.   Respiratory: Negative for cough, chest tightness, shortness of breath and wheezing.   Cardiovascular: Negative for chest pain and palpitations.  Gastrointestinal: Negative for abdominal pain, constipation, diarrhea, nausea and vomiting.  Endocrine: Negative for cold intolerance, heat intolerance, polydipsia and polyuria.       Pituitary adenoma  Musculoskeletal: Negative for arthralgias, back pain, joint swelling and neck pain.  Skin: Negative for rash.       Along left side of her neck.   Allergic/Immunologic: Negative for environmental allergies.  Neurological: Positive for headaches. Negative for tremors and numbness.  Hematological: Negative for adenopathy. Does not bruise/bleed easily.  Psychiatric/Behavioral: Negative for behavioral problems and sleep disturbance. The patient is not nervous/anxious.     Today's Vitals   06/12/18 0838  BP: 128/82  Pulse: 78  Resp: 16  SpO2: 97%  Weight: 226 lb (102.5 kg)  Height: 5\' 5"  (1.651 m)   Body mass index is 37.61 kg/m.  Physical Exam Vitals signs and nursing note reviewed.  Constitutional:      General: She is not in acute distress.    Appearance: Normal appearance. She is well-developed. She is not diaphoretic.  HENT:     Head: Normocephalic and atraumatic.     Mouth/Throat:     Pharynx: No oropharyngeal exudate.   Eyes:     Pupils: Pupils are equal, round, and reactive to light.  Neck:     Musculoskeletal: Normal range of motion and neck supple.     Thyroid: No thyromegaly.     Vascular: No JVD.     Trachea: No tracheal deviation.  Cardiovascular:     Rate and Rhythm: Normal rate and regular rhythm.     Heart sounds: Normal heart sounds. No murmur. No friction rub. No gallop.   Pulmonary:     Effort: Pulmonary effort is normal. No respiratory distress.     Breath sounds: No wheezing or rales.  Chest:     Chest wall: No tenderness.  Abdominal:     General: Bowel sounds are normal.     Palpations: Abdomen is soft.  Musculoskeletal: Normal range of  motion.  Lymphadenopathy:     Cervical: No cervical adenopathy.  Skin:    General: Skin is warm and dry.     Findings: Rash present.     Comments: There is patchy, red, erythematous rash along the base of left side of neck. Skin feels rough and dry. Not itchy. Skin intact.   Neurological:     Mental Status: She is alert and oriented to person, place, and time.     Cranial Nerves: No cranial nerve deficit.  Psychiatric:        Behavior: Behavior normal.        Thought Content: Thought content normal.        Judgment: Judgment normal.   Assessment/Plan: 1. Chronic migraine Doing very well. Continue Emgality injections monthly to lower severity and frequency of acute headaches. Use zomig as needed for acute headaches.  - Galcanezumab-gnlm (EMGALITY) 120 MG/ML SOAJ; Inject 120 mg into the skin every 30 (thirty) days.  Dispense: 1 pen; Refill: 5  2. Pituitary adenoma ([place]) Most recent mri in 2017 showing stable pituitary microadenoma. Will refer to endocrinology for further evaluation and treatment . - Ambulatory referral to Endocrinology  3. Atopic dermatitis, unspecified type Start triamcinolone 0.025% cream. Apply twice daily when needed.  - triamcinolone (KENALOG) 0.025 % cream; Apply 1 application topically 2 (two) times daily.  Dispense:  80 g; Refill: 2  4. Moderate obesity Restart phentermine daily. Limit calorie intake to 1500 calories per day and incorporate exercise into daily routine.  - phentermine (ADIPEX-P) 37.5 MG tablet; Take 1 tablet (37.5 mg total) by mouth daily before breakfast.  Dispense: 30 tablet; Refill: 0  General Counseling: Nidya verbalizes understanding of the findings of todays visit and agrees with plan of treatment. I have discussed any further diagnostic evaluation that may be needed or ordered today. We also reviewed her medications today. she has been encouraged to call the office with any questions or concerns that should arise related to todays visit.   There is a liability release in patients' chart. There has been a 10 minute discussion about the side effects including but not limited to elevated blood pressure, anxiety, lack of sleep and dry mouth. Pt understands and will like to start/continue on appetite suppressant at this time. There will be one month RX given at the time of visit with proper follow up. [place] diet plan with restricted calories is given to the pt. Pt understands and agrees with  plan of treatment  This patient was seen by Leretha Pol FNP Collaboration with Dr Lavera Guise as a part of collaborative care agreement  Orders Placed This Encounter  Procedures  . Ambulatory referral to Endocrinology    Meds ordered this encounter  Medications  . Galcanezumab-gnlm (EMGALITY) 120 MG/ML SOAJ    Sig: Inject 120 mg into the skin every 30 (thirty) days.    Dispense:  1 pen    Refill:  5    The patient has copay savings card.    Order Specific Question:   Supervising Provider    Answer:   Lavera Guise [[id number]]  . phentermine (ADIPEX-P) 37.5 MG tablet    Sig: Take 1 tablet (37.5 mg total) by mouth daily before breakfast.    Dispense:  30 tablet    Refill:  0    Order Specific Question:   Supervising Provider    Answer:   Lavera Guise [place]  . triamcinolone (KENALOG) 0.025 %  cream    Sig: Apply  1 application topically 2 (two) times daily.    Dispense:  80 g    Refill:  2    Order Specific Question:   Supervising Provider    Answer:   Lavera Guise [[id number]]    Time spent: 71 Minutes      Dr Lavera Guise Internal medicine

## 2018-07-22 ENCOUNTER — Ambulatory Visit: Payer: Self-pay | Admitting: Nurse Practitioner

## 2018-09-05 ENCOUNTER — Other Ambulatory Visit: Payer: Self-pay

## 2018-09-05 DIAGNOSIS — IMO0002 Reserved for concepts with insufficient information to code with codable children: Secondary | ICD-10-CM

## 2018-09-05 DIAGNOSIS — G43709 Chronic migraine without aura, not intractable, without status migrainosus: Secondary | ICD-10-CM

## 2018-09-05 MED ORDER — ZOLMITRIPTAN 5 MG PO TABS: 5.0000 mg | ORAL_TABLET | ORAL | 2 refills | Status: DC | PRN

## 2018-12-11 ENCOUNTER — Other Ambulatory Visit: Payer: Self-pay

## 2018-12-11 ENCOUNTER — Encounter: Payer: Self-pay | Admitting: Nurse Practitioner

## 2018-12-11 ENCOUNTER — Ambulatory Visit: Payer: Managed Care, Other (non HMO) | Admitting: Nurse Practitioner

## 2018-12-11 VITALS — BP 139/96 | HR 104 | Resp 16 | Ht 66.0 in | Wt 227.6 lb

## 2018-12-11 DIAGNOSIS — F411 Generalized anxiety disorder: Secondary | ICD-10-CM | POA: Diagnosis not present

## 2018-12-11 DIAGNOSIS — G43709 Chronic migraine without aura, not intractable, without status migrainosus: Secondary | ICD-10-CM

## 2018-12-11 DIAGNOSIS — I10 Essential (primary) hypertension: Secondary | ICD-10-CM | POA: Diagnosis not present

## 2018-12-11 DIAGNOSIS — IMO0002 Reserved for concepts with insufficient information to code with codable children: Secondary | ICD-10-CM

## 2018-12-11 MED ORDER — ATENOLOL 25 MG PO TABS: 25.0000 mg | ORAL_TABLET | Freq: Every day | ORAL | 3 refills | Status: DC

## 2018-12-11 MED ORDER — EMGALITY 120 MG/ML ~~LOC~~ SOAJ: 120.0000 mg | SUBCUTANEOUS | 5 refills | Status: DC

## 2018-12-11 MED ORDER — BUSPIRONE HCL 10 MG PO TABS: 10.0000 mg | ORAL_TABLET | Freq: Two times a day (BID) | ORAL | 3 refills | Status: DC

## 2018-12-11 NOTE — Progress Notes (Signed)
[facility] [place], [place] [address]  Internal MEDICINE  Office Visit Note  Patient Name: Barbara Stephens  [id number]  [id number]  Date of Service: 12/24/2018  Chief Complaint  Patient presents with  . Follow-up    weight management  . Migraine  . Hypertension    lately bp has been elevated. mother passed in april and feels that may be from the stress or anxiety  . Insomnia    The patient is here for routine follow up. She states that she has had some increased stress and she has noted blood pressure has increased. She feels like the increased anxiety levels she is experiencing may be contributing to higher pressures. She states that working from home and having to help her daughter with remote learning for school is really increasing her stress. Feels very irritable and is quick to snap at her daughter. She reports that she continues to do very well with migraine headaches. Taking emgality to control migraines has improved the overall quality of her life. She still gets intermittent migraines and they are usually when time is close to taking the next shot. They are relieved by taking her medication for migraine relief.       Current Medication: Outpatient Encounter Medications as of 12/11/2018  Medication Sig  . Galcanezumab-gnlm (EMGALITY) 120 MG/ML SOAJ Inject 120 mg into the skin every 30 (thirty) days.  . phentermine (ADIPEX-P) 37.5 MG tablet Take 1 tablet (37.5 mg total) by mouth daily before breakfast.  . triamcinolone (KENALOG) 0.025 % cream Apply 1 application topically 2 (two) times daily.  Marland Kitchen zolmitriptan (ZOMIG) 5 MG tablet Take 1 tablet (5 mg total) by mouth as needed for migraine.  . [DISCONTINUED] Galcanezumab-gnlm (EMGALITY) 120 MG/ML SOAJ Inject 120 mg into the skin every 30 (thirty) days.  Marland Kitchen atenolol (TENORMIN) 25 MG tablet Take 1 tablet (25 mg total) by mouth daily.  . busPIRone (BUSPAR) 10 MG tablet Take 1 tablet (10 mg total) by mouth 2  (two) times daily.   No facility-administered encounter medications on file as of 12/11/2018.     Surgical History: Past Surgical History:  Procedure Laterality Date  . CESAREAN SECTION      Medical History: Past Medical History:  Diagnosis Date  . Migraines     Family History: Family History  Problem Relation Age of Onset  . Thyroid disease Mother   . Heart disease Mother   . Lung cancer Father   . Diabetes Father     Social History   Socioeconomic History  . Marital status: Legally Separated    Spouse name: Not on file  . Number of children: Not on file  . Years of education: Not on file  . Highest education level: Not on file  Occupational History  . Not on file  Social Needs  . Financial resource strain: Not on file  . Food insecurity    Worry: Not on file    Inability: Not on file  . Transportation needs    Medical: Not on file    Non-medical: Not on file  Tobacco Use  . Smoking status: Former Smoker    Types: Cigarettes  . Smokeless tobacco: Never Used  Substance and Sexual Activity  . Alcohol use: Yes    Frequency: Never    Comment: occasionally  . Drug use: Never  . Sexual activity: Not on file  Lifestyle  . Physical activity    Days per week: Not on file  Minutes per session: Not on file  . Stress: Not on file  Relationships  . Social Herbalist on phone: Not on file    Gets together: Not on file    Attends religious service: Not on file    Active member of club or organization: Not on file    Attends meetings of clubs or organizations: Not on file    Relationship status: Not on file  . Intimate partner violence    Fear of current or ex partner: Not on file    Emotionally abused: Not on file    Physically abused: Not on file    Forced sexual activity: Not on file  Other Topics Concern  . Not on file  Social History Narrative  . Not on file      Review of Systems  Constitutional: Negative for chills, fatigue and  unexpected weight change.       Stable weight since her last visit .  HENT: Negative for congestion, rhinorrhea, sneezing and sore throat.   Respiratory: Negative for cough, chest tightness, shortness of breath and wheezing.   Cardiovascular: Negative for chest pain and palpitations.       Elevated blood pressure readings recently  Gastrointestinal: Negative for abdominal pain, constipation, diarrhea, nausea and vomiting.  Endocrine: Negative for cold intolerance, heat intolerance, polydipsia and polyuria.       Pituitary adenoma  Musculoskeletal: Negative for arthralgias, back pain, joint swelling and neck pain.  Skin: Negative for rash.  Allergic/Immunologic: Negative for environmental allergies.  Neurological: Positive for headaches. Negative for tremors and numbness.  Hematological: Negative for adenopathy. Does not bruise/bleed easily.  Psychiatric/Behavioral: Positive for dysphoric mood. Negative for behavioral problems and sleep disturbance. The patient is nervous/anxious.     Today's Vitals   12/11/18 1606  BP: (!) 139/96  Pulse: (!) 104  Resp: 16  SpO2: 98%  Weight: 227 lb 9.6 oz (103.2 kg)  Height: 5\' 6"  (1.676 m)   Body mass index is 36.74 kg/m.  Physical Exam Vitals signs and nursing note reviewed.  Constitutional:      General: She is not in acute distress.    Appearance: Normal appearance. She is well-developed. She is obese. She is not diaphoretic.  HENT:     Head: Normocephalic and atraumatic.     Mouth/Throat:     Pharynx: No oropharyngeal exudate.  Eyes:     Pupils: Pupils are equal, round, and reactive to light.  Neck:     Musculoskeletal: Normal range of motion and neck supple.     Thyroid: No thyromegaly.     Vascular: No JVD.     Trachea: No tracheal deviation.  Cardiovascular:     Rate and Rhythm: Normal rate and regular rhythm.     Heart sounds: Normal heart sounds. No murmur. No friction rub. No gallop.   Pulmonary:     Effort: Pulmonary  effort is normal. No respiratory distress.     Breath sounds: Normal breath sounds. No wheezing or rales.  Chest:     Chest wall: No tenderness.  Abdominal:     Palpations: Abdomen is soft.  Musculoskeletal: Normal range of motion.  Lymphadenopathy:     Cervical: No cervical adenopathy.  Skin:    General: Skin is warm and dry.     Findings: Rash present.     Comments: There is patchy, red, erythematous rash along the base of left side of neck. Skin feels rough and dry. Not itchy. Skin intact.  Neurological:     Mental Status: She is alert and oriented to person, place, and time.     Cranial Nerves: No cranial nerve deficit.  Psychiatric:        Attention and Perception: Attention and perception normal.        Mood and Affect: Mood is anxious and depressed.        Speech: Speech normal.        Behavior: Behavior normal.        Thought Content: Thought content normal.        Cognition and Memory: Cognition and memory normal.        Judgment: Judgment normal.    Assessment/Plan: 1. Essential hypertension Start atenolol 25mg  daily. Advised her to avoid excess salt and increase water in her diet. Incorporate exercise into daily routine.  - atenolol (TENORMIN) 25 MG tablet; Take 1 tablet (25 mg total) by mouth daily.  Dispense: 30 tablet; Refill: 3  2. Chronic migraine Improved. Continue emgality injections monthly. Use zomig as needed and as prescribed for acute headaches . - Galcanezumab-gnlm (EMGALITY) 120 MG/ML SOAJ; Inject 120 mg into the skin every 30 (thirty) days.  Dispense: 1 pen; Refill: 5  3. Generalized anxiety disorder Add buspirone 10mg . May take twice daily as needed for acute anxiety.  - busPIRone (BUSPAR) 10 MG tablet; Take 1 tablet (10 mg total) by mouth 2 (two) times daily.  Dispense: 60 tablet; Refill: 3  General Counseling: Barbara Stephens verbalizes understanding of the findings of todays visit and agrees with plan of treatment. I have discussed any further diagnostic  evaluation that may be needed or ordered today. We also reviewed her medications today. she has been encouraged to call the office with any questions or concerns that should arise related to todays visit.  Hypertension Counseling:   The following hypertensive lifestyle modification were recommended and discussed:  1. Limiting alcohol intake to less than 1 oz/day of ethanol:(24 oz of beer or 8 oz of wine or 2 oz of 100-proof whiskey). 2. Take baby ASA 81 mg daily. 3. Importance of regular aerobic exercise and losing weight. 4. Reduce dietary saturated fat and cholesterol intake for overall cardiovascular health. 5. Maintaining adequate dietary potassium, calcium, and magnesium intake. 6. Regular monitoring of the blood pressure. 7. Reduce sodium intake to less than 100 mmol/day (less than 2.3 gm of sodium or less than 6 gm of sodium choride)   This patient was seen by [facility] with Dr Lavera Guise as a part of collaborative care agreement  Meds ordered this encounter  Medications  . Galcanezumab-gnlm (EMGALITY) 120 MG/ML SOAJ    Sig: Inject 120 mg into the skin every 30 (thirty) days.    Dispense:  1 pen    Refill:  5    The patient has copay savings card.    Order Specific Question:   Supervising Provider    Answer:   Lavera Guise [id number]  . atenolol (TENORMIN) 25 MG tablet    Sig: Take 1 tablet (25 mg total) by mouth daily.    Dispense:  30 tablet    Refill:  3    Order Specific Question:   Supervising Provider    Answer:   Lavera Guise [id number]  . busPIRone (BUSPAR) 10 MG tablet    Sig: Take 1 tablet (10 mg total) by mouth 2 (two) times daily.    Dispense:  60 tablet    Refill:  3    Order  Specific Question:   Supervising Provider    Answer:   Lavera Guise [id number]    Time spent: 25 Minutes      Dr Lavera Guise Internal medicine

## 2018-12-24 DIAGNOSIS — I10 Essential (primary) hypertension: Secondary | ICD-10-CM | POA: Insufficient documentation

## 2018-12-24 DIAGNOSIS — F411 Generalized anxiety disorder: Secondary | ICD-10-CM | POA: Insufficient documentation

## 2019-01-08 ENCOUNTER — Telehealth: Payer: Self-pay

## 2019-01-08 ENCOUNTER — Ambulatory Visit: Payer: Managed Care, Other (non HMO) | Admitting: Nurse Practitioner

## 2019-01-08 ENCOUNTER — Encounter: Payer: Self-pay | Admitting: Nurse Practitioner

## 2019-01-08 ENCOUNTER — Other Ambulatory Visit: Payer: Self-pay

## 2019-01-08 VITALS — BP 128/86 | HR 82 | Temp 97.6°F | Resp 16 | Ht 66.0 in | Wt 228.0 lb

## 2019-01-08 DIAGNOSIS — I10 Essential (primary) hypertension: Secondary | ICD-10-CM

## 2019-01-08 DIAGNOSIS — J0110 Acute frontal sinusitis, unspecified: Secondary | ICD-10-CM | POA: Diagnosis not present

## 2019-01-08 DIAGNOSIS — IMO0002 Reserved for concepts with insufficient information to code with codable children: Secondary | ICD-10-CM

## 2019-01-08 DIAGNOSIS — G43709 Chronic migraine without aura, not intractable, without status migrainosus: Secondary | ICD-10-CM

## 2019-01-08 DIAGNOSIS — F411 Generalized anxiety disorder: Secondary | ICD-10-CM | POA: Diagnosis not present

## 2019-01-08 MED ORDER — AMOXICILLIN 875 MG PO TABS: 875.0000 mg | ORAL_TABLET | Freq: Two times a day (BID) | ORAL | 0 refills | Status: DC

## 2019-01-08 MED ORDER — ALPRAZOLAM 0.25 MG PO TABS: 0.2500 mg | ORAL_TABLET | Freq: Every evening | ORAL | 2 refills | Status: DC | PRN

## 2019-01-08 MED ORDER — EMGALITY 120 MG/ML ~~LOC~~ SOAJ: 120.0000 mg | SUBCUTANEOUS | 5 refills | Status: DC

## 2019-01-08 NOTE — Progress Notes (Signed)
[facility] [place],  [address]  Internal MEDICINE  Office Visit Note  Patient Name: Barbara Stephens  [id number]  [id number]  Date of Service: 01/18/2019  Chief Complaint  Patient presents with  . Follow-up    review labs  . Hypertension    started atenolol  . Ear Pain    left ear sometimes, feels like she might have ear infection    The patient is here for follow up of blood pressure. Was started on atenolol 25mg  daily at last visit. She states that blood pressure at home is generally running [id number] and [id number] systolic and in the 0000000 diastolic. Heart rate Is a little lower. Feels a little better now than prior to starting this medication. Blood pressure is moderately elevated today. She is frustrated with lack of weight loss despite watching her calorie intake. Has been unable to take phentermine lately due to elevated blood pressure.       Current Medication: Outpatient Encounter Medications as of 01/08/2019  Medication Sig  . ALPRAZolam (XANAX) 0.25 MG tablet Take 1 tablet (0.25 mg total) by mouth at bedtime as needed for anxiety.  Marland Kitchen amoxicillin (AMOXIL) 875 MG tablet Take 1 tablet (875 mg total) by mouth 2 (two) times daily.  Marland Kitchen atenolol (TENORMIN) 25 MG tablet Take 1 tablet (25 mg total) by mouth daily.  . busPIRone (BUSPAR) 10 MG tablet Take 1 tablet (10 mg total) by mouth 2 (two) times daily.  . Galcanezumab-gnlm (EMGALITY) 120 MG/ML SOAJ Inject 120 mg into the skin every 30 (thirty) days.  . phentermine (ADIPEX-P) 37.5 MG tablet Take 1 tablet (37.5 mg total) by mouth daily before breakfast.  . triamcinolone (KENALOG) 0.025 % cream Apply 1 application topically 2 (two) times daily.  Marland Kitchen zolmitriptan (ZOMIG) 5 MG tablet Take 1 tablet (5 mg total) by mouth as needed for migraine.  . [DISCONTINUED] Galcanezumab-gnlm (EMGALITY) 120 MG/ML SOAJ Inject 120 mg into the skin every 30 (thirty) days.   No facility-administered encounter medications on file  as of 01/08/2019.     Surgical History: Past Surgical History:  Procedure Laterality Date  . CESAREAN SECTION      Medical History: Past Medical History:  Diagnosis Date  . Migraines     Family History: Family History  Problem Relation Age of Onset  . Thyroid disease Mother   . Heart disease Mother   . Lung cancer Father   . Diabetes Father     Social History   Socioeconomic History  . Marital status: Legally Separated    Spouse name: Not on file  . Number of children: Not on file  . Years of education: Not on file  . Highest education level: Not on file  Occupational History  . Not on file  Social Needs  . Financial resource strain: Not on file  . Food insecurity    Worry: Not on file    Inability: Not on file  . Transportation needs    Medical: Not on file    Non-medical: Not on file  Tobacco Use  . Smoking status: Former Smoker    Types: Cigarettes  . Smokeless tobacco: Never Used  Substance and Sexual Activity  . Alcohol use: Yes    Frequency: Never    Comment: occasionally  . Drug use: Never  . Sexual activity: Not on file  Lifestyle  . Physical activity    Days per week: Not on file    Minutes per session: Not on file  .  Stress: Not on file  Relationships  . Social Herbalist on phone: Not on file    Gets together: Not on file    Attends religious service: Not on file    Active member of club or organization: Not on file    Attends meetings of clubs or organizations: Not on file    Relationship status: Not on file  . Intimate partner violence    Fear of current or ex partner: Not on file    Emotionally abused: Not on file    Physically abused: Not on file    Forced sexual activity: Not on file  Other Topics Concern  . Not on file  Social History Narrative  . Not on file      Review of Systems  Constitutional: Negative for chills, fatigue and unexpected weight change.  HENT: Negative for congestion, rhinorrhea, sneezing and  sore throat.   Respiratory: Negative for cough, chest tightness, shortness of breath and wheezing.   Cardiovascular: Negative for chest pain and palpitations.  Gastrointestinal: Negative for abdominal pain, constipation, diarrhea, nausea and vomiting.  Endocrine: Negative for cold intolerance, heat intolerance, polydipsia and polyuria.       Pituitary adenoma  Musculoskeletal: Negative for arthralgias, back pain, joint swelling and neck pain.  Skin: Negative for rash.  Allergic/Immunologic: Negative for environmental allergies.  Neurological: Positive for headaches. Negative for tremors and numbness.  Hematological: Negative for adenopathy. Does not bruise/bleed easily.  Psychiatric/Behavioral: Positive for dysphoric mood. Negative for behavioral problems and sleep disturbance. The patient is nervous/anxious.     Today's Vitals   01/08/19 1602  BP: 128/86  Pulse: 82  Resp: 16  Temp: 97.6 F (36.4 C)  SpO2: 98%  Weight: 228 lb (103.4 kg)  Height: 5\' 6"  (1.676 m)   Body mass index is 36.8 kg/m.   Physical Exam Vitals signs and nursing note reviewed.  Constitutional:      General: She is not in acute distress.    Appearance: Normal appearance. She is well-developed. She is obese. She is not diaphoretic.  HENT:     Head: Normocephalic and atraumatic.     Right Ear: Tympanic membrane is erythematous and bulging.     Left Ear: Tympanic membrane is bulging.     Nose: Congestion present.     Right Turbinates: Swollen.     Left Turbinates: Swollen.     Right Sinus: Frontal sinus tenderness present.     Left Sinus: Frontal sinus tenderness present.     Mouth/Throat:     Pharynx: No oropharyngeal exudate.     Tonsils: 1+ on the right. 1+ on the left.  Eyes:     Pupils: Pupils are equal, round, and reactive to light.  Neck:     Musculoskeletal: Normal range of motion and neck supple.     Thyroid: No thyromegaly.     Vascular: No JVD.     Trachea: No tracheal deviation.   Cardiovascular:     Rate and Rhythm: Normal rate and regular rhythm.     Heart sounds: Normal heart sounds. No murmur. No friction rub. No gallop.   Pulmonary:     Effort: Pulmonary effort is normal. No respiratory distress.     Breath sounds: Normal breath sounds. No wheezing or rales.     Comments: Dry, harsh cough present . Chest:     Chest wall: No tenderness.  Abdominal:     Palpations: Abdomen is soft.  Musculoskeletal: Normal range of motion.  Skin:    General: Skin is warm and dry.  Neurological:     Mental Status: She is alert and oriented to person, place, and time.     Cranial Nerves: No cranial nerve deficit.  Psychiatric:        Mood and Affect: Mood normal.        Behavior: Behavior normal.        Thought Content: Thought content normal.        Judgment: Judgment normal.   Assessment/Plan: 1. Acute non-recurrent frontal sinusitis Start amoxicillin 875mg  twice daily for 10 days. Rest and increase fluids. OTC medications should be taken as needed and as indicated to improve symptoms.  - amoxicillin (AMOXIL) 875 MG tablet; Take 1 tablet (875 mg total) by mouth 2 (two) times daily.  Dispense: 20 tablet; Refill: 0  2. Essential hypertension Improve. Continue atenolol as prescribed   3. Chronic migraine Continue once monthly emgality. Use abortive therapy as needed and as prescribed.  - Galcanezumab-gnlm (EMGALITY) 120 MG/ML SOAJ; Inject 120 mg into the skin every 30 (thirty) days.  Dispense: 1 pen; Refill: 5  4. Generalized anxiety disorder May take alprazolam 0.25mg  at bedtime as needed for acute anxiety. New prescription provided today  - ALPRAZolam (XANAX) 0.25 MG tablet; Take 1 tablet (0.25 mg total) by mouth at bedtime as needed for anxiety.  Dispense: 30 tablet; Refill: 2  General Counseling: Addy verbalizes understanding of the findings of todays visit and agrees with plan of treatment. I have discussed any further diagnostic evaluation that may be needed or  ordered today. We also reviewed her medications today. she has been encouraged to call the office with any questions or concerns that should arise related to todays visit.  This patient was seen by [place] with Dr Lavera Guise as a part of collaborative care agreement  Meds ordered this encounter  Medications  . Galcanezumab-gnlm (EMGALITY) 120 MG/ML SOAJ    Sig: Inject 120 mg into the skin every 30 (thirty) days.    Dispense:  1 pen    Refill:  5    The patient has copay savings card.    Order Specific Question:   Supervising Provider    Answer:   Lavera Guise [id number]  . amoxicillin (AMOXIL) 875 MG tablet    Sig: Take 1 tablet (875 mg total) by mouth 2 (two) times daily.    Dispense:  20 tablet    Refill:  0    Order Specific Question:   Supervising Provider    Answer:   Lavera Guise [id number]  . ALPRAZolam (XANAX) 0.25 MG tablet    Sig: Take 1 tablet (0.25 mg total) by mouth at bedtime as needed for anxiety.    Dispense:  30 tablet    Refill:  2    Order Specific Question:   Supervising Provider    Answer:   Lavera Guise [id number]    Time spent: 69 Minutes      Dr Lavera Guise Internal medicine

## 2019-01-08 NOTE — Telephone Encounter (Signed)
Error

## 2019-01-18 DIAGNOSIS — J0110 Acute frontal sinusitis, unspecified: Secondary | ICD-10-CM | POA: Insufficient documentation

## 2019-04-08 ENCOUNTER — Telehealth: Payer: Self-pay

## 2019-04-08 NOTE — Telephone Encounter (Signed)
LMOM FOR PATIENT TO CONFIRM AND SCREEN FOR 04-13-19 OV.

## 2019-04-13 ENCOUNTER — Ambulatory Visit: Payer: Managed Care, Other (non HMO) | Admitting: Nurse Practitioner

## 2019-04-13 DIAGNOSIS — Z008 Encounter for other general examination: Secondary | ICD-10-CM

## 2019-04-17 ENCOUNTER — Telehealth: Payer: Self-pay

## 2019-04-17 NOTE — Telephone Encounter (Signed)
Billed patient missed appointment fee 04/13/19.

## 2019-04-20 ENCOUNTER — Telehealth: Payer: Self-pay

## 2019-04-20 NOTE — Telephone Encounter (Signed)
Confirmed appointment with patient. [id number] [phone]

## 2019-04-21 ENCOUNTER — Ambulatory Visit: Payer: Managed Care, Other (non HMO) | Admitting: Adult Health

## 2019-04-21 ENCOUNTER — Other Ambulatory Visit: Payer: Self-pay

## 2019-04-21 ENCOUNTER — Encounter: Payer: Self-pay | Admitting: Adult Health

## 2019-04-21 VITALS — HR 83 | Temp 98.7°F

## 2019-04-21 DIAGNOSIS — F411 Generalized anxiety disorder: Secondary | ICD-10-CM

## 2019-04-21 DIAGNOSIS — H6691 Otitis media, unspecified, right ear: Secondary | ICD-10-CM | POA: Diagnosis not present

## 2019-04-21 DIAGNOSIS — I10 Essential (primary) hypertension: Secondary | ICD-10-CM | POA: Diagnosis not present

## 2019-04-21 MED ORDER — AMOXICILLIN-POT CLAVULANATE 875-125 MG PO TABS: 1.0000 | ORAL_TABLET | Freq: Two times a day (BID) | ORAL | 0 refills | Status: DC

## 2019-04-21 NOTE — Progress Notes (Signed)
[facility] [place], [place] [address]  Internal MEDICINE  Telephone Visit  Patient Name: Barbara Stephens  [id number]  [id number]  Date of Service: 04/21/2019  I connected with the patient at 1037 by telephone and verified the patients identity using two identifiers.   I discussed the limitations, risks, security and privacy concerns of performing an evaluation and management service by telephone and the availability of in person appointments. I also discussed with the patient that there may be a patient responsible charge related to the service.  The patient expressed understanding and agrees to proceed.    Chief Complaint  Patient presents with  . Telephone Assessment  . Telephone Screen  . Ear Pain    right   . Sinusitis    HPI  Pt is seen via video.  She reports right ear pain for 2 days. It is painful to touch around her ear.  She denies fever, or chills.  She does have some sinus congestion which is not abnormal for her.  She denies any covid exposure recently.    Current Medication: Outpatient Encounter Medications as of 04/21/2019  Medication Sig  . ALPRAZolam (XANAX) 0.25 MG tablet Take 1 tablet (0.25 mg total) by mouth at bedtime as needed for anxiety.  Marland Kitchen amoxicillin (AMOXIL) 875 MG tablet Take 1 tablet (875 mg total) by mouth 2 (two) times daily.  Marland Kitchen atenolol (TENORMIN) 25 MG tablet Take 1 tablet (25 mg total) by mouth daily.  . busPIRone (BUSPAR) 10 MG tablet Take 1 tablet (10 mg total) by mouth 2 (two) times daily.  . Galcanezumab-gnlm (EMGALITY) 120 MG/ML SOAJ Inject 120 mg into the skin every 30 (thirty) days.  . phentermine (ADIPEX-P) 37.5 MG tablet Take 1 tablet (37.5 mg total) by mouth daily before breakfast.  . triamcinolone (KENALOG) 0.025 % cream Apply 1 application topically 2 (two) times daily.  Marland Kitchen zolmitriptan (ZOMIG) 5 MG tablet Take 1 tablet (5 mg total) by mouth as needed for migraine.  Marland Kitchen amoxicillin-clavulanate (AUGMENTIN)  875-125 MG tablet Take 1 tablet by mouth 2 (two) times daily.   No facility-administered encounter medications on file as of 04/21/2019.    Surgical History: Past Surgical History:  Procedure Laterality Date  . CESAREAN SECTION      Medical History: Past Medical History:  Diagnosis Date  . Migraines     Family History: Family History  Problem Relation Age of Onset  . Thyroid disease Mother   . Heart disease Mother   . Lung cancer Father   . Diabetes Father     Social History   Socioeconomic History  . Marital status: Legally Separated    Spouse name: Not on file  . Number of children: Not on file  . Years of education: Not on file  . Highest education level: Not on file  Occupational History  . Not on file  Tobacco Use  . Smoking status: Former Smoker    Types: Cigarettes  . Smokeless tobacco: Never Used  Substance and Sexual Activity  . Alcohol use: Yes    Comment: occasionally  . Drug use: Never  . Sexual activity: Not on file  Other Topics Concern  . Not on file  Social History Narrative  . Not on file   Social Determinants of Health   Financial Resource Strain:   . Difficulty of Paying Living Expenses: Not on file  Food Insecurity:   . Worried About Charity fundraiser in the Last Year: Not on  file  . [place] in the Last Year: Not on file  Transportation Needs:   . Lack of Transportation (Medical): Not on file  . Lack of Transportation (Non-Medical): Not on file  Physical Activity:   . Days of Exercise per Week: Not on file  . Minutes of Exercise per Session: Not on file  Stress:   . Feeling of Stress : Not on file  Social Connections:   . Frequency of Communication with Friends and Family: Not on file  . Frequency of Social Gatherings with Friends and Family: Not on file  . Attends Religious Services: Not on file  . Active Member of Clubs or Organizations: Not on file  . Attends Archivist Meetings: Not on file  . Marital  Status: Not on file  Intimate Partner Violence:   . Fear of Current or Ex-Partner: Not on file  . Emotionally Abused: Not on file  . Physically Abused: Not on file  . Sexually Abused: Not on file      Review of Systems  Constitutional: Negative for chills, fatigue and unexpected weight change.  HENT: Positive for ear pain, mouth sores and sinus pressure. Negative for congestion, rhinorrhea, sneezing and sore throat.   Eyes: Negative for photophobia, pain and redness.  Respiratory: Negative for cough, chest tightness and shortness of breath.   Cardiovascular: Negative for chest pain and palpitations.  Gastrointestinal: Negative for abdominal pain, constipation, diarrhea, nausea and vomiting.  Endocrine: Negative.   Genitourinary: Negative for dysuria and frequency.  Musculoskeletal: Negative for arthralgias, back pain, joint swelling and neck pain.  Skin: Negative for rash.  Allergic/Immunologic: Negative.   Neurological: Negative for tremors and numbness.  Hematological: Negative for adenopathy. Does not bruise/bleed easily.  Psychiatric/Behavioral: Negative for behavioral problems and sleep disturbance. The patient is not nervous/anxious.     Vital Signs: Pulse 83   Temp 98.7 F (37.1 C)   SpO2 97%    Observation/Objective:  Well appearing, NAd noted.   Assessment/Plan: 1. Otitis of right ear Advised patient to take entire course of antibiotics as prescribed with food. Pt should return to clinic in 7-10 days if symptoms fail to improve or new symptoms develop.  - amoxicillin-clavulanate (AUGMENTIN) 875-125 MG tablet; Take 1 tablet by mouth 2 (two) times daily.  Dispense: 14 tablet; Refill: 0  2. Essential hypertension Controlled, continue present management.  3. Generalized anxiety disorder Stable,continue present management.  General Counseling: aseret schweinsberg understanding of the findings of today's phone visit and agrees with plan of treatment. I have discussed  any further diagnostic evaluation that may be needed or ordered today. We also reviewed her medications today. she has been encouraged to call the office with any questions or concerns that should arise related to todays visit.    No orders of the defined types were placed in this encounter.   Meds ordered this encounter  Medications  . amoxicillin-clavulanate (AUGMENTIN) 875-125 MG tablet    Sig: Take 1 tablet by mouth 2 (two) times daily.    Dispense:  14 tablet    Refill:  0    Time spent: [place] AGNP-C Internal medicine

## 2019-05-11 ENCOUNTER — Other Ambulatory Visit: Payer: Self-pay

## 2019-05-11 DIAGNOSIS — I10 Essential (primary) hypertension: Secondary | ICD-10-CM

## 2019-05-11 MED ORDER — ATENOLOL 25 MG PO TABS: 25.0000 mg | ORAL_TABLET | Freq: Every day | ORAL | 3 refills | Status: DC

## 2019-05-12 ENCOUNTER — Ambulatory Visit: Payer: Managed Care, Other (non HMO) | Admitting: Adult Health

## 2019-05-12 ENCOUNTER — Encounter: Payer: Self-pay | Admitting: Adult Health

## 2019-05-12 VITALS — Ht 65.0 in | Wt 210.0 lb

## 2019-05-12 DIAGNOSIS — G43709 Chronic migraine without aura, not intractable, without status migrainosus: Secondary | ICD-10-CM

## 2019-05-12 DIAGNOSIS — IMO0002 Reserved for concepts with insufficient information to code with codable children: Secondary | ICD-10-CM

## 2019-05-12 DIAGNOSIS — F411 Generalized anxiety disorder: Secondary | ICD-10-CM

## 2019-05-12 DIAGNOSIS — E668 Other obesity: Secondary | ICD-10-CM

## 2019-05-12 DIAGNOSIS — I10 Essential (primary) hypertension: Secondary | ICD-10-CM

## 2019-05-12 MED ORDER — EMGALITY 120 MG/ML ~~LOC~~ SOAJ: 120.0000 mg | SUBCUTANEOUS | 5 refills | Status: DC

## 2019-05-12 NOTE — Progress Notes (Signed)
[facility] [place], [place] [address]  Internal MEDICINE  Telephone Visit  Patient Name: Barbara Stephens  [id number]  [id number]  Date of Service: 05/12/2019  I connected with the patient at 1247 by telephone and verified the patients identity using two identifiers.   I discussed the limitations, risks, security and privacy concerns of performing an evaluation and management service by telephone and the availability of in person appointments. I also discussed with the patient that there may be a patient responsible charge related to the service.  The patient expressed understanding and agrees to proceed.    Chief Complaint  Patient presents with  . Telephone Assessment  . Telephone Screen  . Migraine  . Hypertension    HPI  Pt is seen today via video. She is following up on BP and migraines.  She reports she is due for an emgality shot right now.  She has had three migraines.  She has been unable to fill th RX at the pharmacy because they need a PA, which hasn't been processed yet.  She had two samples.  Her bp has been good, she Denies Chest pain, Shortness of breath, palpitations, headache, or blurred vision.     Current Medication: Outpatient Encounter Medications as of 05/12/2019  Medication Sig  . ALPRAZolam (XANAX) 0.25 MG tablet Take 1 tablet (0.25 mg total) by mouth at bedtime as needed for anxiety.  Marland Kitchen atenolol (TENORMIN) 25 MG tablet Take 1 tablet (25 mg total) by mouth daily.  . busPIRone (BUSPAR) 10 MG tablet Take 1 tablet (10 mg total) by mouth 2 (two) times daily.  . Galcanezumab-gnlm (EMGALITY) 120 MG/ML SOAJ Inject 120 mg into the skin every 30 (thirty) days.  Marland Kitchen triamcinolone (KENALOG) 0.025 % cream Apply 1 application topically 2 (two) times daily.  Marland Kitchen zolmitriptan (ZOMIG) 5 MG tablet Take 1 tablet (5 mg total) by mouth as needed for migraine.  . [DISCONTINUED] amoxicillin (AMOXIL) 875 MG tablet Take 1 tablet (875 mg total) by mouth 2 (two)  times daily.  . [DISCONTINUED] amoxicillin-clavulanate (AUGMENTIN) 875-125 MG tablet Take 1 tablet by mouth 2 (two) times daily.  . [DISCONTINUED] Galcanezumab-gnlm (EMGALITY) 120 MG/ML SOAJ Inject 120 mg into the skin every 30 (thirty) days.  . phentermine (ADIPEX-P) 37.5 MG tablet Take 1 tablet (37.5 mg total) by mouth daily before breakfast. (Patient not taking: Reported on 05/12/2019)   No facility-administered encounter medications on file as of 05/12/2019.    Surgical History: Past Surgical History:  Procedure Laterality Date  . CESAREAN SECTION      Medical History: Past Medical History:  Diagnosis Date  . Hypertension   . Migraines     Family History: Family History  Problem Relation Age of Onset  . Thyroid disease Mother   . Heart disease Mother   . Lung cancer Father   . Diabetes Father     Social History   Socioeconomic History  . Marital status: Legally Separated    Spouse name: Not on file  . Number of children: Not on file  . Years of education: Not on file  . Highest education level: Not on file  Occupational History  . Not on file  Tobacco Use  . Smoking status: Former Smoker    Types: Cigarettes  . Smokeless tobacco: Never Used  Substance and Sexual Activity  . Alcohol use: Yes    Comment: occasionally  . Drug use: Never  . Sexual activity: Not on file  Other Topics Concern  .  Not on file  Social History Narrative  . Not on file   Social Determinants of Health   Financial Resource Strain:   . Difficulty of Paying Living Expenses: Not on file  Food Insecurity:   . Worried About Charity fundraiser in the Last Year: Not on file  . Ran Out of Food in the Last Year: Not on file  Transportation Needs:   . Lack of Transportation (Medical): Not on file  . Lack of Transportation (Non-Medical): Not on file  Physical Activity:   . Days of Exercise per Week: Not on file  . Minutes of Exercise per Session: Not on file  Stress:   . Feeling of Stress  : Not on file  Social Connections:   . Frequency of Communication with Friends and Family: Not on file  . Frequency of Social Gatherings with Friends and Family: Not on file  . Attends Religious Services: Not on file  . Active Member of Clubs or Organizations: Not on file  . Attends Archivist Meetings: Not on file  . Marital Status: Not on file  Intimate Partner Violence:   . Fear of Current or Ex-Partner: Not on file  . Emotionally Abused: Not on file  . Physically Abused: Not on file  . Sexually Abused: Not on file      Review of Systems  Constitutional: Negative for chills, fatigue and unexpected weight change.  HENT: Negative for congestion, rhinorrhea, sneezing and sore throat.   Eyes: Negative for photophobia, pain and redness.  Respiratory: Negative for cough, chest tightness and shortness of breath.   Cardiovascular: Negative for chest pain and palpitations.  Gastrointestinal: Negative for abdominal pain, constipation, diarrhea, nausea and vomiting.  Endocrine: Negative.   Genitourinary: Negative for dysuria and frequency.  Musculoskeletal: Negative for arthralgias, back pain, joint swelling and neck pain.  Skin: Negative for rash.  Allergic/Immunologic: Negative.   Neurological: Negative for tremors and numbness.  Hematological: Negative for adenopathy. Does not bruise/bleed easily.  Psychiatric/Behavioral: Negative for behavioral problems and sleep disturbance. The patient is not nervous/anxious.     Vital Signs: Ht 5\' 5"  (1.651 m)   Wt 210 lb (95.3 kg)   BMI 34.95 kg/m    Observation/Objective:  Well appearing, NAD noted    Assessment/Plan: 1. Chronic migraine Resent Emgality to pharmacy.  - Galcanezumab-gnlm (EMGALITY) 120 MG/ML SOAJ; Inject 120 mg into the skin every 30 (thirty) days.  Dispense: 1 pen; Refill: 5  2. Essential hypertension Stable, continue present management  3. Generalized anxiety disorder Controlled, continue current  regimen.  4. Moderate obesity Obesity Counseling: Risk Assessment: An assessment of behavioral risk factors was made today and includes lack of exercise sedentary lifestyle, lack of portion control and poor dietary habits.  Risk Modification Advice: She was counseled on portion control guidelines. Restricting daily caloric intake to 1800. The detrimental long term effects of obesity on her health and ongoing poor compliance was also discussed with the patient.    General Counseling: alessia lojewski understanding of the findings of today's phone visit and agrees with plan of treatment. I have discussed any further diagnostic evaluation that may be needed or ordered today. We also reviewed her medications today. she has been encouraged to call the office with any questions or concerns that should arise related to todays visit.    No orders of the defined types were placed in this encounter.   Meds ordered this encounter  Medications  . Galcanezumab-gnlm (EMGALITY) 120 MG/ML SOAJ  Sig: Inject 120 mg into the skin every 30 (thirty) days.    Dispense:  1 pen    Refill:  5    Time spent:20 [place] AGNP-C Internal medicine

## 2019-05-12 NOTE — Progress Notes (Signed)
[facility] [place], [place] [address]  Internal MEDICINE  Telephone Visit  Patient Name: Barbara Stephens  [id number]  [id number]  Date of Service: 05/12/2019  I connected with the patient at 1247 by telephone and verified the patients identity using two identifiers.   I discussed the limitations, risks, security and privacy concerns of performing an evaluation and management service by telephone and the availability of in person appointments. I also discussed with the patient that there may be a patient responsible charge related to the service.  The patient expressed understanding and agrees to proceed.    Chief Complaint  Patient presents with  . Telephone Assessment  . Telephone Screen  . Migraine  . Hypertension    HPI  Pt is seen today via video. She is following up on BP and migraines.  She reports she is due for an emgality shot right now.  She has had three migraines.  She has been unable to fill th RX at the pharmacy because they need a PA, which hasn't been processed yet.  She had two samples.  Her bp has been good, she Denies Chest pain, Shortness of breath, palpitations, headache, or blurred vision.     Current Medication: Outpatient Encounter Medications as of 05/12/2019  Medication Sig  . ALPRAZolam (XANAX) 0.25 MG tablet Take 1 tablet (0.25 mg total) by mouth at bedtime as needed for anxiety.  Marland Kitchen atenolol (TENORMIN) 25 MG tablet Take 1 tablet (25 mg total) by mouth daily.  . busPIRone (BUSPAR) 10 MG tablet Take 1 tablet (10 mg total) by mouth 2 (two) times daily.  . Galcanezumab-gnlm (EMGALITY) 120 MG/ML SOAJ Inject 120 mg into the skin every 30 (thirty) days.  Marland Kitchen triamcinolone (KENALOG) 0.025 % cream Apply 1 application topically 2 (two) times daily.  Marland Kitchen zolmitriptan (ZOMIG) 5 MG tablet Take 1 tablet (5 mg total) by mouth as needed for migraine.  . [DISCONTINUED] amoxicillin (AMOXIL) 875 MG tablet Take 1 tablet (875 mg total) by mouth 2 (two)  times daily.  . [DISCONTINUED] amoxicillin-clavulanate (AUGMENTIN) 875-125 MG tablet Take 1 tablet by mouth 2 (two) times daily.  . phentermine (ADIPEX-P) 37.5 MG tablet Take 1 tablet (37.5 mg total) by mouth daily before breakfast. (Patient not taking: Reported on 05/12/2019)   No facility-administered encounter medications on file as of 05/12/2019.    Surgical History: Past Surgical History:  Procedure Laterality Date  . CESAREAN SECTION      Medical History: Past Medical History:  Diagnosis Date  . Hypertension   . Migraines     Family History: Family History  Problem Relation Age of Onset  . Thyroid disease Mother   . Heart disease Mother   . Lung cancer Father   . Diabetes Father     Social History   Socioeconomic History  . Marital status: Legally Separated    Spouse name: Not on file  . Number of children: Not on file  . Years of education: Not on file  . Highest education level: Not on file  Occupational History  . Not on file  Tobacco Use  . Smoking status: Former Smoker    Types: Cigarettes  . Smokeless tobacco: Never Used  Substance and Sexual Activity  . Alcohol use: Yes    Comment: occasionally  . Drug use: Never  . Sexual activity: Not on file  Other Topics Concern  . Not on file  Social History Narrative  . Not on file   Social Determinants  of Health   Financial Resource Strain:   . Difficulty of Paying Living Expenses: Not on file  Food Insecurity:   . Worried About Charity fundraiser in the Last Year: Not on file  . Ran Out of Food in the Last Year: Not on file  Transportation Needs:   . Lack of Transportation (Medical): Not on file  . Lack of Transportation (Non-Medical): Not on file  Physical Activity:   . Days of Exercise per Week: Not on file  . Minutes of Exercise per Session: Not on file  Stress:   . Feeling of Stress : Not on file  Social Connections:   . Frequency of Communication with Friends and Family: Not on file  .  Frequency of Social Gatherings with Friends and Family: Not on file  . Attends Religious Services: Not on file  . Active Member of Clubs or Organizations: Not on file  . Attends Archivist Meetings: Not on file  . Marital Status: Not on file  Intimate Partner Violence:   . Fear of Current or Ex-Partner: Not on file  . Emotionally Abused: Not on file  . Physically Abused: Not on file  . Sexually Abused: Not on file      Review of Systems  Constitutional: Negative for chills, fatigue and unexpected weight change.  HENT: Negative for congestion, rhinorrhea, sneezing and sore throat.   Eyes: Negative for photophobia, pain and redness.  Respiratory: Negative for cough, chest tightness and shortness of breath.   Cardiovascular: Negative for chest pain and palpitations.  Gastrointestinal: Negative for abdominal pain, constipation, diarrhea, nausea and vomiting.  Endocrine: Negative.   Genitourinary: Negative for dysuria and frequency.  Musculoskeletal: Negative for arthralgias, back pain, joint swelling and neck pain.  Skin: Negative for rash.  Allergic/Immunologic: Negative.   Neurological: Negative for tremors and numbness.  Hematological: Negative for adenopathy. Does not bruise/bleed easily.  Psychiatric/Behavioral: Negative for behavioral problems and sleep disturbance. The patient is not nervous/anxious.     Vital Signs: Ht 5\' 5"  (1.651 m)   Wt 210 lb (95.3 kg)   BMI 34.95 kg/m    Observation/Objective:  Well sounding, NAD noted.    Assessment/Plan:   General Counseling: Maegen verbalizes understanding of the findings of today's phone visit and agrees with plan of treatment. I have discussed any further diagnostic evaluation that may be needed or ordered today. We also reviewed her medications today. she has been encouraged to call the office with any questions or concerns that should arise related to todays visit.    No orders of the defined types were  placed in this encounter.   No orders of the defined types were placed in this encounter.   Time spent: [place] AGNP-C Internal medicine

## 2019-07-02 ENCOUNTER — Telehealth: Payer: Self-pay

## 2019-07-02 NOTE — Telephone Encounter (Signed)
CONFIRMED AND SCREENED FOR 07-07-19 OV.

## 2019-07-07 ENCOUNTER — Ambulatory Visit: Payer: Managed Care, Other (non HMO) | Admitting: Nurse Practitioner

## 2019-07-07 ENCOUNTER — Encounter: Payer: Self-pay | Admitting: Nurse Practitioner

## 2019-07-07 VITALS — BP 130/93 | HR 94 | Temp 97.8°F | Resp 16 | Ht 65.0 in | Wt 218.2 lb

## 2019-07-07 DIAGNOSIS — G43709 Chronic migraine without aura, not intractable, without status migrainosus: Secondary | ICD-10-CM

## 2019-07-07 DIAGNOSIS — F411 Generalized anxiety disorder: Secondary | ICD-10-CM | POA: Diagnosis not present

## 2019-07-07 DIAGNOSIS — E668 Other obesity: Secondary | ICD-10-CM

## 2019-07-07 DIAGNOSIS — I10 Essential (primary) hypertension: Secondary | ICD-10-CM

## 2019-07-07 DIAGNOSIS — IMO0002 Reserved for concepts with insufficient information to code with codable children: Secondary | ICD-10-CM

## 2019-07-07 MED ORDER — PHENTERMINE HCL 37.5 MG PO TABS: 37.5000 mg | ORAL_TABLET | Freq: Every day | ORAL | 0 refills | Status: DC

## 2019-07-07 MED ORDER — ALPRAZOLAM 0.25 MG PO TABS
0.2500 mg | ORAL_TABLET | Freq: Every evening | ORAL | 2 refills | Status: DC | PRN
Start: 2019-07-07 — End: 2020-03-24

## 2019-07-07 MED ORDER — EMGALITY 120 MG/ML ~~LOC~~ SOAJ: 120.0000 mg | SUBCUTANEOUS | 5 refills | Status: DC

## 2019-07-07 NOTE — Progress Notes (Signed)
[facility] [place], [place] [address]  Internal MEDICINE  Office Visit Note  Patient Name: Barbara Stephens  [id number]  [id number]  Date of Service: 07/19/2019  Chief Complaint  Patient presents with  . Hypertension  . Hyperlipidemia  . Migraine    The patient is here for routine follow up. She states that migraine headaches are well managed. Continue to take emgality monthly. She states that this medication has been so effective, she barely has any acute migraines. She has been on other preventive medications in the past, which include beta-blockers, topamax, and calcium channel blockers. She continues to take atenolol. These have not helped her migraines and they caused negative side effects.  She is concerned about weight gain. She has gained eight pounds since her last visit. She is ready to improve her diet choices and get back to regular exercise program. She would like to restart phentermine to help her with weight loss. Her blood pressure and heart rate are well controlled. She has taken phentermine in the past and has had no negative side effects from taking this in the past.       Current Medication: Outpatient Encounter Medications as of 07/07/2019  Medication Sig  . ALPRAZolam (XANAX) 0.25 MG tablet Take 1 tablet (0.25 mg total) by mouth at bedtime as needed for anxiety.  Marland Kitchen atenolol (TENORMIN) 25 MG tablet Take 1 tablet (25 mg total) by mouth daily.  . busPIRone (BUSPAR) 10 MG tablet Take 1 tablet (10 mg total) by mouth 2 (two) times daily.  . Galcanezumab-gnlm (EMGALITY) 120 MG/ML SOAJ Inject 120 mg into the skin every 30 (thirty) days.  Marland Kitchen triamcinolone (KENALOG) 0.025 % cream Apply 1 application topically 2 (two) times daily.  Marland Kitchen zolmitriptan (ZOMIG) 5 MG tablet Take 1 tablet (5 mg total) by mouth as needed for migraine.  . [DISCONTINUED] ALPRAZolam (XANAX) 0.25 MG tablet Take 1 tablet (0.25 mg total) by mouth at bedtime as needed for anxiety.   . [DISCONTINUED] Galcanezumab-gnlm (EMGALITY) 120 MG/ML SOAJ Inject 120 mg into the skin every 30 (thirty) days.  . phentermine (ADIPEX-P) 37.5 MG tablet Take 1 tablet (37.5 mg total) by mouth daily before breakfast.  . [DISCONTINUED] phentermine (ADIPEX-P) 37.5 MG tablet Take 1 tablet (37.5 mg total) by mouth daily before breakfast. (Patient not taking: Reported on 07/07/2019)   No facility-administered encounter medications on file as of 07/07/2019.    Surgical History: Past Surgical History:  Procedure Laterality Date  . CESAREAN SECTION      Medical History: Past Medical History:  Diagnosis Date  . Hypertension   . Migraines     Family History: Family History  Problem Relation Age of Onset  . Thyroid disease Mother   . Heart disease Mother   . Lung cancer Father   . Diabetes Father     Social History   Socioeconomic History  . Marital status: Legally Separated    Spouse name: Not on file  . Number of children: Not on file  . Years of education: Not on file  . Highest education level: Not on file  Occupational History  . Not on file  Tobacco Use  . Smoking status: Former Smoker    Types: Cigarettes  . Smokeless tobacco: Never Used  Substance and Sexual Activity  . Alcohol use: Yes    Comment: occasionally  . Drug use: Never  . Sexual activity: Not on file  Other Topics Concern  . Not on file  Social History  Narrative  . Not on file   Social Determinants of Health   Financial Resource Strain:   . Difficulty of Paying Living Expenses:   Food Insecurity:   . Worried About Charity fundraiser in the Last Year:   . Arboriculturist in the Last Year:   Transportation Needs:   . Film/video editor (Medical):   Marland Kitchen Lack of Transportation (Non-Medical):   Physical Activity:   . Days of Exercise per Week:   . Minutes of Exercise per Session:   Stress:   . Feeling of Stress :   Social Connections:   . Frequency of Communication with Friends and Family:    . Frequency of Social Gatherings with Friends and Family:   . Attends Religious Services:   . Active Member of Clubs or Organizations:   . Attends Archivist Meetings:   Marland Kitchen Marital Status:   Intimate Partner Violence:   . Fear of Current or Ex-Partner:   . Emotionally Abused:   Marland Kitchen Physically Abused:   . Sexually Abused:       Review of Systems  Constitutional: Negative for chills, fatigue and unexpected weight change.       Eight pound weight gain since her last visit.   HENT: Negative for congestion, rhinorrhea, sneezing and sore throat.   Respiratory: Negative for cough, chest tightness, shortness of breath and wheezing.   Cardiovascular: Negative for chest pain and palpitations.  Gastrointestinal: Negative for abdominal pain, constipation, diarrhea, nausea and vomiting.  Endocrine: Negative for cold intolerance, heat intolerance, polydipsia and polyuria.       Pituitary adenoma. Currently seeing endocrinology for this.   Musculoskeletal: Negative for arthralgias, back pain, joint swelling and neck pain.  Skin: Negative for rash.  Allergic/Immunologic: Negative for environmental allergies.  Neurological: Positive for headaches. Negative for tremors and numbness.  Hematological: Negative for adenopathy. Does not bruise/bleed easily.  Psychiatric/Behavioral: Positive for dysphoric mood. Negative for behavioral problems and sleep disturbance. The patient is nervous/anxious.     Today's Vitals   07/07/19 1201  BP: (!) 130/93  Pulse: 94  Resp: 16  Temp: 97.8 F (36.6 C)  SpO2: 94%  Weight: 218 lb 3.2 oz (99 kg)  Height: 5\' 5"  (1.651 m)   Body mass index is 36.31 kg/m.  Physical Exam Constitutional:      General: She is not in acute distress.    Appearance: Normal appearance. She is well-developed. She is obese. She is not diaphoretic.  HENT:     Head: Normocephalic and atraumatic.     Nose: Nose normal.     Mouth/Throat:     Pharynx: No oropharyngeal  exudate.  Eyes:     Pupils: Pupils are equal, round, and reactive to light.  Neck:     Thyroid: No thyromegaly.     Vascular: No JVD.     Trachea: No tracheal deviation.  Cardiovascular:     Rate and Rhythm: Normal rate and regular rhythm.     Heart sounds: Normal heart sounds. No murmur. No friction rub. No gallop.   Pulmonary:     Effort: Pulmonary effort is normal. No respiratory distress.     Breath sounds: Normal breath sounds. No wheezing or rales.  Chest:     Chest wall: No tenderness.  Abdominal:     Palpations: Abdomen is soft.  Musculoskeletal:        General: Normal range of motion.     Cervical back: Normal range of motion and  neck supple.  Lymphadenopathy:     Cervical: No cervical adenopathy.  Skin:    General: Skin is warm and dry.  Neurological:     Mental Status: She is alert and oriented to person, place, and time.     Cranial Nerves: No cranial nerve deficit.  Psychiatric:        Behavior: Behavior normal.        Thought Content: Thought content normal.        Judgment: Judgment normal.    Assessment/Plan: 1. Essential hypertension Stable. Continue atenolol as prescribed   2. Chronic migraine Doing well with emgality. Continue monthly. New prescription sent to pharmacy. Will try again for prior authorization with this medication.  - Galcanezumab-gnlm (EMGALITY) 120 MG/ML SOAJ; Inject 120 mg into the skin every 30 (thirty) days.  Dispense: 1 pen; Refill: 5  3. Generalized anxiety disorder May continue to take alprazolam 0.25mg  at bedtime as needed for acute anxiety. A new prescription was sent to her pharmacy.  - ALPRAZolam (XANAX) 0.25 MG tablet; Take 1 tablet (0.25 mg total) by mouth at bedtime as needed for anxiety.  Dispense: 30 tablet; Refill: 2  4. Moderate obesity Restart phentermine daily. Limit calorie intake to 1200 to 1500 calories per day. Incorporate exercise into daily routine.  - phentermine (ADIPEX-P) 37.5 MG tablet; Take 1 tablet (37.5  mg total) by mouth daily before breakfast.  Dispense: 30 tablet; Refill: 0 General Counseling: Barbara Stephens verbalizes understanding of the findings of todays visit and agrees with plan of treatment. I have discussed any further diagnostic evaluation that may be needed or ordered today. We also reviewed her medications today. she has been encouraged to call the office with any questions or concerns that should arise related to todays visit.    There is a liability release in patients' chart. There has been a 10 minute discussion about the side effects including but not limited to elevated blood pressure, anxiety, lack of sleep and dry mouth. Pt understands and will like to start/continue on appetite suppressant at this time. There will be one month RX given at the time of visit with proper follow up. Nova diet plan with restricted calories is given to the pt. Pt understands and agrees with  plan of treatment  This patient was seen by Leretha Pol FNP Collaboration with Dr Lavera Guise as a part of collaborative care agreement  Meds ordered this encounter  Medications  . Galcanezumab-gnlm (EMGALITY) 120 MG/ML SOAJ    Sig: Inject 120 mg into the skin every 30 (thirty) days.    Dispense:  1 pen    Refill:  5    Please send prior authorization paperwork.    Order Specific Question:   Supervising Provider    Answer:   Lavera Guise [id number]  . ALPRAZolam (XANAX) 0.25 MG tablet    Sig: Take 1 tablet (0.25 mg total) by mouth at bedtime as needed for anxiety.    Dispense:  30 tablet    Refill:  2    Order Specific Question:   Supervising Provider    Answer:   Lavera Guise [id number]  . phentermine (ADIPEX-P) 37.5 MG tablet    Sig: Take 1 tablet (37.5 mg total) by mouth daily before breakfast.    Dispense:  30 tablet    Refill:  0    Order Specific Question:   Supervising Provider    Answer:   Lavera Guise [id number]    Total time spent: 30 Minutes  Time spent includes review of chart, medications,  test results, and follow up plan with the patient.      Dr Lavera Guise Internal medicine

## 2019-07-31 ENCOUNTER — Telehealth: Payer: Self-pay

## 2019-07-31 NOTE — Telephone Encounter (Signed)
Lmom to confirm and screen for 08-04-19 ov.

## 2019-08-04 ENCOUNTER — Ambulatory Visit: Payer: Managed Care, Other (non HMO) | Admitting: Nurse Practitioner

## 2019-08-04 ENCOUNTER — Other Ambulatory Visit: Payer: Self-pay

## 2019-08-04 ENCOUNTER — Encounter: Payer: Self-pay | Admitting: Nurse Practitioner

## 2019-08-04 VITALS — BP 137/84 | HR 60 | Temp 97.8°F | Resp 16 | Ht 65.0 in | Wt 212.8 lb

## 2019-08-04 DIAGNOSIS — I10 Essential (primary) hypertension: Secondary | ICD-10-CM

## 2019-08-04 DIAGNOSIS — E668 Other obesity: Secondary | ICD-10-CM | POA: Diagnosis not present

## 2019-08-04 DIAGNOSIS — G43709 Chronic migraine without aura, not intractable, without status migrainosus: Secondary | ICD-10-CM | POA: Diagnosis not present

## 2019-08-04 DIAGNOSIS — IMO0002 Reserved for concepts with insufficient information to code with codable children: Secondary | ICD-10-CM

## 2019-08-04 MED ORDER — PHENTERMINE HCL 37.5 MG PO TABS: 37.5000 mg | ORAL_TABLET | Freq: Every day | ORAL | 0 refills | Status: DC

## 2019-08-04 MED ORDER — AIMOVIG 140 MG/ML ~~LOC~~ SOAJ: 140.0000 mg | SUBCUTANEOUS | 5 refills | Status: DC

## 2019-08-04 NOTE — Progress Notes (Signed)
[facility] [place], [place] [address]  Internal MEDICINE  Office Visit Note  Patient Name: Barbara Stephens  [id number]  [id number]  Date of Service: 08/18/2019  Chief Complaint  Patient presents with  . Follow-up    weight managment   . Hypertension    The patient is here for follow up of weight management. She is currently taking phentermine. She has lost 6 pounds since her last visit. She has limited her calorie intake to 1200 calories per day and she is exercising more frequently. Her blood pressure is well managed.   She states that migraine headaches are well managed. Continue to take emgality monthly. She states that this medication has been so effective, she barely has any acute migraines. She has been on other preventive medications in the past, which include beta-blockers, topamax, and calcium channel blockers. She continues to take atenolol. These have not helped her migraines and they caused negative side effects.        Current Medication: Outpatient Encounter Medications as of 08/04/2019  Medication Sig  . ALPRAZolam (XANAX) 0.25 MG tablet Take 1 tablet (0.25 mg total) by mouth at bedtime as needed for anxiety.  Marland Kitchen atenolol (TENORMIN) 25 MG tablet Take 1 tablet (25 mg total) by mouth daily.  . busPIRone (BUSPAR) 10 MG tablet Take 1 tablet (10 mg total) by mouth 2 (two) times daily.  . phentermine (ADIPEX-P) 37.5 MG tablet Take 1 tablet (37.5 mg total) by mouth daily before breakfast.  . triamcinolone (KENALOG) 0.025 % cream Apply 1 application topically 2 (two) times daily.  Marland Kitchen zolmitriptan (ZOMIG) 5 MG tablet Take 1 tablet (5 mg total) by mouth as needed for migraine.  . [DISCONTINUED] Galcanezumab-gnlm (EMGALITY) 120 MG/ML SOAJ Inject 120 mg into the skin every 30 (thirty) days.  . [DISCONTINUED] phentermine (ADIPEX-P) 37.5 MG tablet Take 1 tablet (37.5 mg total) by mouth daily before breakfast.  . Erenumab-aooe (AIMOVIG) 140 MG/ML SOAJ  Inject 140 mg into the skin every 30 (thirty) days.   No facility-administered encounter medications on file as of 08/04/2019.    Surgical History: Past Surgical History:  Procedure Laterality Date  . CESAREAN SECTION      Medical History: Past Medical History:  Diagnosis Date  . Hypertension   . Migraines     Family History: Family History  Problem Relation Age of Onset  . Thyroid disease Mother   . Heart disease Mother   . Lung cancer Father   . Diabetes Father     Social History   Socioeconomic History  . Marital status: Legally Separated    Spouse name: Not on file  . Number of children: Not on file  . Years of education: Not on file  . Highest education level: Not on file  Occupational History  . Not on file  Tobacco Use  . Smoking status: Former Smoker    Types: Cigarettes  . Smokeless tobacco: Never Used  Substance and Sexual Activity  . Alcohol use: Yes    Comment: occasionally  . Drug use: Never  . Sexual activity: Not on file  Other Topics Concern  . Not on file  Social History Narrative  . Not on file   Social Determinants of Health   Financial Resource Strain:   . Difficulty of Paying Living Expenses:   Food Insecurity:   . Worried About Charity fundraiser in the Last Year:   . [place] in the Last Year:  Transportation Needs:   . Film/video editor (Medical):   Marland Kitchen Lack of Transportation (Non-Medical):   Physical Activity:   . Days of Exercise per Week:   . Minutes of Exercise per Session:   Stress:   . Feeling of Stress :   Social Connections:   . Frequency of Communication with Friends and Family:   . Frequency of Social Gatherings with Friends and Family:   . Attends Religious Services:   . Active Member of Clubs or Organizations:   . Attends Archivist Meetings:   Marland Kitchen Marital Status:   Intimate Partner Violence:   . Fear of Current or Ex-Partner:   . Emotionally Abused:   Marland Kitchen Physically Abused:   . Sexually  Abused:       Review of Systems  Constitutional: Negative for chills, fatigue and unexpected weight change.       Six pound weight loss since her last visit.   HENT: Negative for congestion, rhinorrhea, sneezing and sore throat.   Respiratory: Negative for cough, chest tightness, shortness of breath and wheezing.   Cardiovascular: Negative for chest pain and palpitations.  Gastrointestinal: Negative for abdominal pain, constipation, diarrhea, nausea and vomiting.  Endocrine: Negative for cold intolerance, heat intolerance, polydipsia and polyuria.       Pituitary adenoma. Currently seeing endocrinology for this.   Musculoskeletal: Negative for arthralgias, back pain, joint swelling and neck pain.  Skin: Negative for rash.  Allergic/Immunologic: Negative for environmental allergies.  Neurological: Positive for headaches. Negative for tremors and numbness.       Migraine headaches are well controlled while taking aimovig.   Hematological: Negative for adenopathy. Does not bruise/bleed easily.  Psychiatric/Behavioral: Positive for dysphoric mood. Negative for behavioral problems and sleep disturbance. The patient is nervous/anxious.     Today's Vitals   08/04/19 1130  BP: 137/84  Pulse: 60  Resp: 16  Temp: 97.8 F (36.6 C)  SpO2: 98%  Weight: 212 lb 12.8 oz (96.5 kg)  Height: 5\' 5"  (1.651 m)   Body mass index is 35.41 kg/m.  Physical Exam Constitutional:      General: She is not in acute distress.    Appearance: Normal appearance. She is well-developed. She is obese. She is not diaphoretic.  HENT:     Head: Normocephalic and atraumatic.     Nose: Nose normal.     Mouth/Throat:     Pharynx: No oropharyngeal exudate.  Eyes:     Pupils: Pupils are equal, round, and reactive to light.  Neck:     Thyroid: No thyromegaly.     Vascular: No JVD.     Trachea: No tracheal deviation.  Cardiovascular:     Rate and Rhythm: Normal rate and regular rhythm.     Heart sounds: Normal  heart sounds. No murmur. No friction rub. No gallop.   Pulmonary:     Effort: Pulmonary effort is normal. No respiratory distress.     Breath sounds: Normal breath sounds. No wheezing or rales.  Chest:     Chest wall: No tenderness.  Abdominal:     Palpations: Abdomen is soft.  Musculoskeletal:        General: Normal range of motion.     Cervical back: Normal range of motion and neck supple.  Lymphadenopathy:     Cervical: No cervical adenopathy.  Skin:    General: Skin is warm and dry.  Neurological:     Mental Status: She is alert and oriented to person, place, and time.  Cranial Nerves: No cranial nerve deficit.  Psychiatric:        Behavior: Behavior normal.        Thought Content: Thought content normal.        Judgment: Judgment normal.    Assessment/Plan: 1. Chronic migraine Well managed with aimovig. Will continue this monthly. Sample provided. Will complete appeal form so she can conitnue this to control migraine headaches.  - Erenumab-aooe (AIMOVIG) 140 MG/ML SOAJ; Inject 140 mg into the skin every 30 (thirty) days.  Dispense: 1 pen; Refill: 5  2. Essential hypertension Blood pressure stable. Continue bp medication as prescribed   3. Moderate obesity Improving. May continue phentermine daily. Limit calorie intake to 1200 calories per day. Continue to incoporate exercise into daily routine.  - phentermine (ADIPEX-P) 37.5 MG tablet; Take 1 tablet (37.5 mg total) by mouth daily before breakfast.  Dispense: 30 tablet; Refill: 0  General Counseling: Doniesha verbalizes understanding of the findings of todays visit and agrees with plan of treatment. I have discussed any further diagnostic evaluation that may be needed or ordered today. We also reviewed her medications today. she has been encouraged to call the office with any questions or concerns that should arise related to todays visit.    There is a liability release in patients' chart. There has been a 10 minute  discussion about the side effects including but not limited to elevated blood pressure, anxiety, lack of sleep and dry mouth. Pt understands and will like to start/continue on appetite suppressant at this time. There will be one month RX given at the time of visit with proper follow up. Nova diet plan with restricted calories is given to the pt. Pt understands and agrees with  plan of treatment  This patient was seen by Leretha Pol FNP Collaboration with Dr Lavera Guise as a part of collaborative care agreement  Meds ordered this encounter  Medications  . phentermine (ADIPEX-P) 37.5 MG tablet    Sig: Take 1 tablet (37.5 mg total) by mouth daily before breakfast.    Dispense:  30 tablet    Refill:  0    Order Specific Question:   Supervising Provider    Answer:   Lavera Guise [place]  . Erenumab-aooe (AIMOVIG) 140 MG/ML SOAJ    Sig: Inject 140 mg into the skin every 30 (thirty) days.    Dispense:  1 pen    Refill:  5    Samples provided today. If effective will send new prescription to her pharmacy    Order Specific Question:   Supervising Provider    Answer:   Lavera Guise [id number]    Total time spent: 25 Minutes   Time spent includes review of chart, medications, test results, and follow up plan with the patient.      Dr Lavera Guise Internal medicine

## 2019-08-13 ENCOUNTER — Encounter: Payer: Self-pay | Admitting: Adult Health

## 2019-08-13 ENCOUNTER — Other Ambulatory Visit: Payer: Self-pay

## 2019-08-13 ENCOUNTER — Ambulatory Visit: Payer: Managed Care, Other (non HMO) | Admitting: Adult Health

## 2019-08-13 VITALS — BP 137/89 | HR 64 | Temp 97.5°F | Resp 16 | Ht 65.0 in | Wt 211.2 lb

## 2019-08-13 DIAGNOSIS — I10 Essential (primary) hypertension: Secondary | ICD-10-CM | POA: Diagnosis not present

## 2019-08-13 DIAGNOSIS — K648 Other hemorrhoids: Secondary | ICD-10-CM

## 2019-08-13 MED ORDER — LIDOCAINE-HYDROCORTISONE ACE 2-2 % RE KIT: 1.0000 "application " | PACK | Freq: Two times a day (BID) | RECTAL | 0 refills | Status: DC

## 2019-08-13 MED ORDER — HYDROCORTISONE ACETATE 25 MG RE SUPP: RECTAL | 0 refills | Status: DC

## 2019-08-13 NOTE — Progress Notes (Signed)
[facility] [place], [place] [address]  Internal MEDICINE  Office Visit Note  Patient Name: Barbara Stephens  [id number]  [id number]  Date of Service: 08/13/2019  Chief Complaint  Patient presents with  . Acute Visit    hemmroids      HPI Pt is here for a sick visit.  She reports for 3 days she has had painful hemmoroids.  She denies any bleeding.  She believes she may have strained to hard during a bowel movement.  She reports the pain is consistent.  It hurts to stand, sit and walk.  She has been using OTC preparation H and Tucks pads. She had a hemorrhoid a few years back     Current Medication:  Outpatient Encounter Medications as of 08/13/2019  Medication Sig  . ALPRAZolam (XANAX) 0.25 MG tablet Take 1 tablet (0.25 mg total) by mouth at bedtime as needed for anxiety.  Marland Kitchen atenolol (TENORMIN) 25 MG tablet Take 1 tablet (25 mg total) by mouth daily.  . busPIRone (BUSPAR) 10 MG tablet Take 1 tablet (10 mg total) by mouth 2 (two) times daily.  Eduard Roux (AIMOVIG) 140 MG/ML SOAJ Inject 140 mg into the skin every 30 (thirty) days.  . phentermine (ADIPEX-P) 37.5 MG tablet Take 1 tablet (37.5 mg total) by mouth daily before breakfast.  . triamcinolone (KENALOG) 0.025 % cream Apply 1 application topically 2 (two) times daily.  Marland Kitchen zolmitriptan (ZOMIG) 5 MG tablet Take 1 tablet (5 mg total) by mouth as needed for migraine.   No facility-administered encounter medications on file as of 08/13/2019.      Medical History: Past Medical History:  Diagnosis Date  . Hypertension   . Migraines      Vital Signs: BP 137/89   Pulse 64   Temp (!) 97.5 F (36.4 C)   Resp 16   Ht 5' 5"  (1.651 m)   Wt 211 lb 3.2 oz (95.8 kg)   SpO2 98%   BMI 35.15 kg/m    Review of Systems  Constitutional: Negative for chills, fatigue and unexpected weight change.  HENT: Negative for congestion, rhinorrhea, sneezing and sore throat.   Eyes: Negative for photophobia,  pain and redness.  Respiratory: Negative for cough, chest tightness and shortness of breath.   Cardiovascular: Negative for chest pain and palpitations.  Gastrointestinal: Negative for abdominal pain, constipation, diarrhea, nausea and vomiting.  Endocrine: Negative.   Genitourinary: Negative for dysuria and frequency.  Musculoskeletal: Negative for arthralgias, back pain, joint swelling and neck pain.  Skin: Negative for rash.  Allergic/Immunologic: Negative.   Neurological: Negative for tremors and numbness.  Hematological: Negative for adenopathy. Does not bruise/bleed easily.  Psychiatric/Behavioral: Negative for behavioral problems and sleep disturbance. The patient is not nervous/anxious.     Physical Exam Vitals and nursing note reviewed.  Constitutional:      General: She is not in acute distress.    Appearance: She is well-developed. She is not diaphoretic.  HENT:     Head: Normocephalic and atraumatic.     Mouth/Throat:     Pharynx: No oropharyngeal exudate.  Eyes:     Pupils: Pupils are equal, round, and reactive to light.  Neck:     Thyroid: No thyromegaly.     Vascular: No JVD.     Trachea: No tracheal deviation.  Cardiovascular:     Rate and Rhythm: Normal rate and regular rhythm.     Heart sounds: Normal heart sounds. No murmur. No friction rub.  No gallop.   Pulmonary:     Effort: Pulmonary effort is normal. No respiratory distress.     Breath sounds: Normal breath sounds. No wheezing or rales.  Chest:     Chest wall: No tenderness.  Abdominal:     Palpations: Abdomen is soft.     Tenderness: There is no abdominal tenderness. There is no guarding.  Genitourinary:    Comments: External hemmorhoids present, Not bleeding at this time.  Musculoskeletal:        General: Normal range of motion.     Cervical back: Normal range of motion and neck supple.  Lymphadenopathy:     Cervical: No cervical adenopathy.  Skin:    General: Skin is warm and dry.   Neurological:     Mental Status: She is alert and oriented to person, place, and time.     Cranial Nerves: No cranial nerve deficit.  Psychiatric:        Behavior: Behavior normal.        Thought Content: Thought content normal.        Judgment: Judgment normal.    Assessment/Plan: 1. Other hemorrhoids Use Anusol and Lidocaine as directed.  - hydrocortisone (ANUSOL-HC) 25 MG suppository; Insert one after EACH bm QD  Dispense: 25 suppository; Refill: 0 - Lidocaine-Hydrocortisone Ace 2-2 % KIT; Place 1 application rectally in the morning and at bedtime.  Dispense: 1 kit; Refill: 0  2. Essential hypertension Continue current medications.   General Counseling: Maicy verbalizes understanding of the findings of todays visit and agrees with plan of treatment. I have discussed any further diagnostic evaluation that may be needed or ordered today. We also reviewed her medications today. she has been encouraged to call the office with any questions or concerns that should arise related to todays visit.   No orders of the defined types were placed in this encounter.   No orders of the defined types were placed in this encounter.   Time spent:30 Minutes  This patient was seen by Orson Gear AGNP-C in Collaboration with Dr Lavera Guise as a part of collaborative care agreement.  [name] AGNP-C Internal Medicine

## 2019-08-28 ENCOUNTER — Telehealth: Payer: Self-pay

## 2019-08-28 NOTE — Telephone Encounter (Signed)
Lmom to confirm and screen for 09-01-19 ov. 

## 2019-09-01 ENCOUNTER — Ambulatory Visit: Payer: Managed Care, Other (non HMO) | Admitting: Nurse Practitioner

## 2019-10-05 ENCOUNTER — Other Ambulatory Visit: Payer: Self-pay

## 2019-10-05 DIAGNOSIS — I10 Essential (primary) hypertension: Secondary | ICD-10-CM

## 2019-10-05 MED ORDER — ATENOLOL 25 MG PO TABS: 25.0000 mg | ORAL_TABLET | Freq: Every day | ORAL | 3 refills | Status: DC

## 2019-12-28 ENCOUNTER — Encounter: Payer: Self-pay | Admitting: Internal Medicine

## 2019-12-28 ENCOUNTER — Ambulatory Visit (INDEPENDENT_AMBULATORY_CARE_PROVIDER_SITE_OTHER): Payer: Managed Care, Other (non HMO) | Admitting: Internal Medicine

## 2019-12-28 ENCOUNTER — Other Ambulatory Visit: Payer: Self-pay

## 2019-12-28 VITALS — BP 138/108 | HR 81 | Temp 97.7°F | Resp 16 | Ht 65.0 in | Wt 217.0 lb

## 2019-12-28 DIAGNOSIS — E668 Other obesity: Secondary | ICD-10-CM | POA: Diagnosis not present

## 2019-12-28 DIAGNOSIS — I10 Essential (primary) hypertension: Secondary | ICD-10-CM

## 2019-12-28 DIAGNOSIS — G43709 Chronic migraine without aura, not intractable, without status migrainosus: Secondary | ICD-10-CM | POA: Diagnosis not present

## 2019-12-28 DIAGNOSIS — F411 Generalized anxiety disorder: Secondary | ICD-10-CM

## 2019-12-28 DIAGNOSIS — IMO0002 Reserved for concepts with insufficient information to code with codable children: Secondary | ICD-10-CM

## 2019-12-28 DIAGNOSIS — R300 Dysuria: Secondary | ICD-10-CM

## 2019-12-28 DIAGNOSIS — Z0001 Encounter for general adult medical examination with abnormal findings: Secondary | ICD-10-CM

## 2019-12-28 MED ORDER — AIMOVIG 140 MG/ML ~~LOC~~ SOAJ: 140.0000 mg | SUBCUTANEOUS | 3 refills | Status: DC

## 2019-12-28 MED ORDER — ATENOLOL 50 MG PO TABS: ORAL_TABLET | ORAL | 1 refills | Status: DC

## 2019-12-28 NOTE — Progress Notes (Signed)
[facility] [place], [place] [address]  Internal MEDICINE  Office Visit Note  Patient Name: Barbara Stephens  [id number]  [id number]  Date of Service: 12/29/2019  Chief Complaint  Patient presents with  . Annual Exam  . Hypertension  . Quality Metric Gaps    HepC,   TDAP,    HPI Pt is here for routine health maintenance examination. She was started on new migraine medicine Aimovig, denies any side effects. Migraine headaches are better. She will continue to monitor. Recently has been started on OCP by her endocrinology. Pt suffers from anxiety, bp is not well controlled either     Pt also problem sleeping at night  Current Medication: Outpatient Encounter Medications as of 12/28/2019  Medication Sig  . ALPRAZolam (XANAX) 0.25 MG tablet Take 1 tablet (0.25 mg total) by mouth at bedtime as needed for anxiety.  Marland Kitchen atenolol (TENORMIN) 50 MG tablet Take one tab po qhs  . busPIRone (BUSPAR) 10 MG tablet Take 1 tablet (10 mg total) by mouth 2 (two) times daily.  . norethindrone-ethinyl estradiol (LOESTRIN) 1-20 MG-MCG tablet Take 1 tablet by mouth daily after supper.   . phentermine (ADIPEX-P) 37.5 MG tablet Take 1 tablet (37.5 mg total) by mouth daily before breakfast.  . zolmitriptan (ZOMIG) 5 MG tablet Take 1 tablet (5 mg total) by mouth as needed for migraine.  . [DISCONTINUED] atenolol (TENORMIN) 25 MG tablet Take 1 tablet (25 mg total) by mouth daily.  . [DISCONTINUED] Erenumab-aooe (AIMOVIG) 140 MG/ML SOAJ Inject 140 mg into the skin every 30 (thirty) days.  . [DISCONTINUED] hydrocortisone (ANUSOL-HC) 25 MG suppository Insert one after EACH bm QD (Patient not taking: Reported on 12/28/2019)  . [DISCONTINUED] Lidocaine-Hydrocortisone Ace 2-2 % KIT Place 1 application rectally in the morning and at bedtime. (Patient not taking: Reported on 12/28/2019)  . [DISCONTINUED] triamcinolone (KENALOG) 0.025 % cream Apply 1 application topically 2 (two) times daily.  (Patient not taking: Reported on 12/28/2019)   No facility-administered encounter medications on file as of 12/28/2019.    Surgical History: Past Surgical History:  Procedure Laterality Date  . CESAREAN SECTION      Medical History: Past Medical History:  Diagnosis Date  . Hypertension   . Migraines     Family History: Family History  Problem Relation Age of Onset  . Thyroid disease Mother   . Heart disease Mother   . Lung cancer Father   . Diabetes Father     Review of Systems  Constitutional: Negative for chills, diaphoresis and fatigue.  HENT: Negative for ear pain, postnasal drip and sinus pressure.   Eyes: Negative for photophobia, discharge, redness, itching and visual disturbance.  Respiratory: Negative for cough, shortness of breath and wheezing.   Cardiovascular: Negative for chest pain, palpitations and leg swelling.  Gastrointestinal: Negative for abdominal pain, constipation, diarrhea, nausea and vomiting.  Genitourinary: Negative for dysuria and flank pain.  Musculoskeletal: Negative for arthralgias, back pain, gait problem and neck pain.  Skin: Negative for color change.  Allergic/Immunologic: Negative for environmental allergies and food allergies.  Neurological: Negative for dizziness and headaches.  Hematological: Does not bruise/bleed easily.  Psychiatric/Behavioral: Negative for agitation, behavioral problems (depression) and hallucinations.     Vital Signs: BP (!) 138/108   Pulse 81   Temp 97.7 F (36.5 C)   Resp 16   Ht _0  (1.651 m)   Wt 217 lb (98.4 kg)   SpO2 96%   BMI 36.11 kg/m  Physical Exam Constitutional:      General: She is not in acute distress.    Appearance: She is well-developed. She is not diaphoretic.  HENT:     Head: Normocephalic and atraumatic.     Mouth/Throat:     Pharynx: No oropharyngeal exudate.  Eyes:     Pupils: Pupils are equal, round, and reactive to light.  Neck:     Thyroid: No thyromegaly.      Vascular: No JVD.     Trachea: No tracheal deviation.  Cardiovascular:     Rate and Rhythm: Normal rate and regular rhythm.     Heart sounds: Normal heart sounds. No murmur heard.  No friction rub. No gallop.   Pulmonary:     Effort: Pulmonary effort is normal. No respiratory distress.     Breath sounds: No wheezing or rales.  Chest:     Chest wall: No tenderness.  Abdominal:     General: Bowel sounds are normal.     Palpations: Abdomen is soft.  Musculoskeletal:        General: Normal range of motion.     Cervical back: Normal range of motion and neck supple.  Lymphadenopathy:     Cervical: No cervical adenopathy.  Skin:    General: Skin is warm and dry.  Neurological:     Mental Status: She is alert and oriented to person, place, and time.     Cranial Nerves: No cranial nerve deficit.  Psychiatric:        Behavior: Behavior normal.        Thought Content: Thought content normal.        Judgment: Judgment normal.    Assessment/Plan: 1. Encounter for general adult medical examination with abnormal findings Update all labs and PHM - CBC with Differential/Platelet - Lipid Panel With LDL/HDL Ratio - TSH - T4, free - Comprehensive metabolic panel - MM DIGITAL SCREENING BILATERAL; Future  2. Essential hypertension Her blood pressure is not well controlled, better blood pressure control will help with migraine headaches too, will increase atenolol to 50 mg po qd  - atenolol (TENORMIN) 50 MG tablet; Take one tab po qhs  Dispense: 90 tablet; Refill: 1  3. Chronic migraine Pt is having difficulty with coverage of Aimovig, will get samples, pt is also on OCP, might have to watch for any worsening of her symptoms   4. Moderate obesity Will not prescribe any appetite suppressants until bp is under better control and pt is consistent with caloric restriction and cardio/wt bearing exercises , Topamax will be a better choice   5. GAD (generalized anxiety disorder) Continue Buspar  for now, she is also on Xanax, will need sleep study to look further   6. Dysuria - UA/M w/rflx Culture, Routine - Microscopic Examination - Urine Culture, Reflex  General Counseling: Raileigh verbalizes understanding of the findings of todays visit and agrees with plan of treatment. I have discussed any further diagnostic evaluation that may be needed or ordered today. We also reviewed her medications today. she has been encouraged to call the office with any questions or concerns that should arise related to todays visit.    Orders Placed This Encounter  Procedures  . Microscopic Examination  . Urine Culture, Reflex  . MM DIGITAL SCREENING BILATERAL  . UA/M w/rflx Culture, Routine  . CBC with Differential/Platelet  . Lipid Panel With LDL/HDL Ratio  . TSH  . T4, free  . Comprehensive metabolic panel    Meds ordered this encounter  Medications  . atenolol (TENORMIN) 50 MG tablet    Sig: Take one tab po qhs    Dispense:  90 tablet    Refill:  1    Total time spent:45 Minutes  Time spent includes review of chart, medications, test results, and follow up plan with the patient.     Lavera Guise, MD  Internal Medicine

## 2020-01-01 ENCOUNTER — Telehealth: Payer: Self-pay

## 2020-01-01 ENCOUNTER — Other Ambulatory Visit: Payer: Self-pay

## 2020-01-01 MED ORDER — NITROFURANTOIN MONOHYD MACRO 100 MG PO CAPS: 100.0000 mg | ORAL_CAPSULE | Freq: Two times a day (BID) | ORAL | 0 refills | Status: AC

## 2020-01-01 NOTE — Progress Notes (Signed)
Please call in macrobid 100 mg po bid x 7 days for UTI, notify her to increase water intake

## 2020-01-01 NOTE — Telephone Encounter (Signed)
Spoke with pt, informed her that we sent over the order for macrobid to pharmacy, and to increase her water intake per DFK

## 2020-01-01 NOTE — Telephone Encounter (Signed)
-----   Message from Lavera Guise, MD sent at 01/01/2020 12:41 PM EDT ----- Please call in macrobid 100 mg po bid x 7 days for UTI, notify her to increase water intake

## 2020-01-03 LAB — MICROSCOPIC EXAMINATION: Casts: NONE SEEN /lpf

## 2020-01-03 LAB — UA/M W/RFLX CULTURE, ROUTINE
Bilirubin, UA: NEGATIVE
Glucose, UA: NEGATIVE
Ketones, UA: NEGATIVE
Leukocytes,UA: NEGATIVE
Nitrite, UA: NEGATIVE
Protein,UA: NEGATIVE
RBC, UA: NEGATIVE
Specific Gravity, UA: 1.013 (ref 1.005–1.030)
Urobilinogen, Ur: 0.2 mg/dL (ref 0.2–1.0)
pH, UA: 8 — ABNORMAL HIGH (ref 5.0–7.5)

## 2020-01-03 LAB — URINE CULTURE, REFLEX

## 2020-01-05 NOTE — Progress Notes (Signed)
Pt notified that finished  antibiotic

## 2020-01-07 ENCOUNTER — Telehealth: Payer: Self-pay

## 2020-01-07 NOTE — Telephone Encounter (Signed)
Aimovig inj. 140mg /IM has been approved for 6 months from 01-06-20 through 07-05-20 ref# [id number]

## 2020-02-05 LAB — LIPID PANEL WITH LDL/HDL RATIO

## 2020-02-06 LAB — COMPREHENSIVE METABOLIC PANEL
ALT: 20 IU/L (ref 0–32)
AST: 15 IU/L (ref 0–40)
Albumin/Globulin Ratio: 1.8 (ref 1.2–2.2)
Albumin: 4.5 g/dL (ref 3.8–4.8)
Alkaline Phosphatase: 65 IU/L (ref 44–121)
BUN/Creatinine Ratio: 13 (ref 9–23)
BUN: 10 mg/dL (ref 6–24)
Bilirubin Total: 0.3 mg/dL (ref 0.0–1.2)
CO2: 23 mmol/L (ref 20–29)
Calcium: 9.4 mg/dL (ref 8.7–10.2)
Chloride: 101 mmol/L (ref 96–106)
Creatinine, Ser: 0.77 mg/dL (ref 0.57–1.00)
GFR calc Af Amer: 109 mL/min/{1.73_m2} (ref >59)
GFR calc non Af Amer: 94 mL/min/{1.73_m2} (ref >59)
Globulin, Total: 2.5 g/dL (ref 1.5–4.5)
Glucose: 75 mg/dL (ref 65–99)
Potassium: 5 mmol/L (ref 3.5–5.2)
Sodium: 138 mmol/L (ref 134–144)
Total Protein: 7 g/dL (ref 6.0–8.5)

## 2020-02-06 LAB — CBC WITH DIFFERENTIAL/PLATELET
Basophils Absolute: 0.1 10*3/uL (ref 0.0–0.2)
Basos: 1 %
EOS (ABSOLUTE): 0.3 10*3/uL (ref 0.0–0.4)
Eos: 3 %
Hematocrit: 40.4 % (ref 34.0–46.6)
Hemoglobin: 13.1 g/dL (ref 11.1–15.9)
Immature Grans (Abs): 0 10*3/uL (ref 0.0–0.1)
Immature Granulocytes: 0 %
Lymphocytes Absolute: 2.5 10*3/uL (ref 0.7–3.1)
Lymphs: 26 %
MCH: 29.6 pg (ref 26.6–33.0)
MCHC: 32.4 g/dL (ref 31.5–35.7)
MCV: 91 fL (ref 79–97)
Monocytes Absolute: 0.8 10*3/uL (ref 0.1–0.9)
Monocytes: 9 %
Neutrophils Absolute: 6 10*3/uL (ref 1.4–7.0)
Neutrophils: 61 %
Platelets: 422 10*3/uL (ref 150–450)
RBC: 4.43 x10E6/uL (ref 3.77–5.28)
RDW: 12.4 % (ref 11.7–15.4)
WBC: 9.7 10*3/uL (ref 3.4–10.8)

## 2020-02-06 LAB — T4, FREE: Free T4: 1.03 ng/dL (ref 0.82–1.77)

## 2020-02-06 LAB — LIPID PANEL WITH LDL/HDL RATIO
Cholesterol, Total: 206 mg/dL — ABNORMAL HIGH (ref 100–199)
HDL: 43 mg/dL (ref >39)
LDL Chol Calc (NIH): 137 mg/dL — ABNORMAL HIGH (ref 0–99)
LDL/HDL Ratio: 3.2 ratio (ref 0.0–3.2)
Triglycerides: 142 mg/dL (ref 0–149)
VLDL Cholesterol Cal: 26 mg/dL (ref 5–40)

## 2020-02-06 LAB — TSH: TSH: 2.6 u[IU]/mL (ref 0.450–4.500)

## 2020-02-08 ENCOUNTER — Encounter: Payer: Self-pay | Admitting: Nurse Practitioner

## 2020-02-08 ENCOUNTER — Ambulatory Visit: Payer: Managed Care, Other (non HMO) | Admitting: Nurse Practitioner

## 2020-02-08 ENCOUNTER — Other Ambulatory Visit: Payer: Self-pay

## 2020-02-08 VITALS — BP 116/87 | HR 71 | Temp 97.6°F | Resp 16 | Ht 65.0 in | Wt 218.4 lb

## 2020-02-08 DIAGNOSIS — I10 Essential (primary) hypertension: Secondary | ICD-10-CM

## 2020-02-08 DIAGNOSIS — G43111 Migraine with aura, intractable, with status migrainosus: Secondary | ICD-10-CM

## 2020-02-08 DIAGNOSIS — G43109 Migraine with aura, not intractable, without status migrainosus: Secondary | ICD-10-CM

## 2020-02-08 DIAGNOSIS — E668 Other obesity: Secondary | ICD-10-CM | POA: Diagnosis not present

## 2020-02-08 MED ORDER — AIMOVIG 140 MG/ML ~~LOC~~ SOAJ: 140.0000 mg | SUBCUTANEOUS | 3 refills | Status: DC

## 2020-02-08 MED ORDER — ZOLMITRIPTAN 5 MG PO TABS: 5.0000 mg | ORAL_TABLET | ORAL | 1 refills | Status: DC | PRN

## 2020-02-08 NOTE — Progress Notes (Signed)
Barbara Stephens, [place] [address]  Internal MEDICINE  Office Visit Note  Patient Name: Barbara Stephens  [id number]  [id number]  Date of Service: 02/28/2020  Chief Complaint  Patient presents with  . Follow-up    review labs  . Hypertension  . Quality Metric Gaps    tetnaus,hepC   . controlled substance form    reviewed with PT  . Medication Refill    The patient is here for routine follow up. She states that insurance will not cover aimovig or emgality which have both been tried to lower the frequency and severity of migraine headaches. When she is on these preventive medications, she has very few migraines and rarely has to take abortive therapy. Will do trial of Ajovy to see if this will have better coverage.  Patient has been given samples in past. Works very well for her. Has few, if any migraines. Has tried and failed multiple preventive meds in the past, including topamax, beta blockers, and calcium channel blockers.  Blood pressure much improved with increased atenolol.  Blood work reviewed. Mild elevation of LDL and total cholesterol only. Discussed prudent diet and exercise to help lower these numbers.      Current Medication: Outpatient Encounter Medications as of 02/08/2020  Medication Sig  . ALPRAZolam (XANAX) 0.25 MG tablet Take 1 tablet (0.25 mg total) by mouth at bedtime as needed for anxiety.  Marland Kitchen atenolol (TENORMIN) 50 MG tablet Take one tab po qhs  . busPIRone (BUSPAR) 10 MG tablet Take 1 tablet (10 mg total) by mouth 2 (two) times daily.  . norethindrone-ethinyl estradiol (LOESTRIN) 1-20 MG-MCG tablet Take 1 tablet by mouth daily after supper.   . zolmitriptan (ZOMIG) 5 MG tablet Take 1 tablet (5 mg total) by mouth as needed for migraine.  . [DISCONTINUED] Erenumab-aooe (AIMOVIG) 140 MG/ML SOAJ Inject 140 mg into the skin every 30 (thirty) days.  . [DISCONTINUED] phentermine (ADIPEX-P) 37.5 MG tablet Take 1 tablet (37.5 mg total)  by mouth daily before breakfast.  . [DISCONTINUED] zolmitriptan (ZOMIG) 5 MG tablet Take 1 tablet (5 mg total) by mouth as needed for migraine.  Barbara Stephens (AIMOVIG) 140 MG/ML SOAJ Inject 140 mg into the skin every 30 (thirty) days.   No facility-administered encounter medications on file as of 02/08/2020.    Surgical History: Past Surgical History:  Procedure Laterality Date  . CESAREAN SECTION      Medical History: Past Medical History:  Diagnosis Date  . Hypertension   . Migraines     Family History: Family History  Problem Relation Age of Onset  . Thyroid disease Mother   . Heart disease Mother   . Lung cancer Father   . Diabetes Father     Social History   Socioeconomic History  . Marital status: Legally Separated    Spouse name: Not on file  . Number of children: Not on file  . Years of education: Not on file  . Highest education level: Not on file  Occupational History  . Not on file  Tobacco Use  . Smoking status: Former Smoker    Types: Cigarettes  . Smokeless tobacco: Never Used  Vaping Use  . Vaping Use: Former  Substance and Sexual Activity  . Alcohol use: Yes    Comment: occasionally  . Drug use: Never  . Sexual activity: Not on file  Other Topics Concern  . Not on file  Social History Narrative  . Not on file  Social Determinants of Health   Financial Resource Strain:   . Difficulty of Paying Living Expenses: Not on file  Food Insecurity:   . Worried About Charity fundraiser in the Last Year: Not on file  . Ran Out of Food in the Last Year: Not on file  Transportation Needs:   . Lack of Transportation (Medical): Not on file  . Lack of Transportation (Non-Medical): Not on file  Physical Activity:   . Days of Exercise per Week: Not on file  . Minutes of Exercise per Session: Not on file  Stress:   . Feeling of Stress : Not on file  Social Connections:   . Frequency of Communication with Friends and Family: Not on file  .  Frequency of Social Gatherings with Friends and Family: Not on file  . Attends Religious Services: Not on file  . Active Member of Clubs or Organizations: Not on file  . Attends Archivist Meetings: Not on file  . Marital Status: Not on file  Intimate Partner Violence:   . Fear of Current or Ex-Partner: Not on file  . Emotionally Abused: Not on file  . Physically Abused: Not on file  . Sexually Abused: Not on file      Review of Systems  Constitutional: Negative for activity change, chills, fatigue and unexpected weight change.  HENT: Negative for congestion, postnasal drip, rhinorrhea, sneezing and sore throat.   Respiratory: Negative for cough, chest tightness and shortness of breath.   Cardiovascular: Negative for chest pain and palpitations.       Blood pressure improved with higher dose of atenolol   Gastrointestinal: Negative for abdominal pain, constipation, diarrhea, nausea and vomiting.  Endocrine: Negative for cold intolerance, heat intolerance, polydipsia and polyuria.  Musculoskeletal: Negative for arthralgias, back pain, joint swelling and neck pain.  Skin: Negative for rash.  Neurological: Positive for headaches. Negative for tremors and numbness.       Chronic migraines.  Hematological: Negative for adenopathy. Does not bruise/bleed easily.  Psychiatric/Behavioral: Negative for behavioral problems (Depression), sleep disturbance and suicidal ideas. The patient is nervous/anxious.     Today's Vitals   02/08/20 1001  BP: 116/87  Pulse: 71  Resp: 16  Temp: 97.6 F (36.4 C)  SpO2: 98%  Weight: 218 lb 6.4 oz (99.1 kg)  Height: 5\' 5"  (1.651 m)   Body mass index is 36.34 kg/m.  Physical Exam Vitals and nursing note reviewed.  Constitutional:      General: She is not in acute distress.    Appearance: Normal appearance. She is well-developed. She is obese. She is not diaphoretic.  HENT:     Head: Normocephalic and atraumatic.     Nose: Nose normal.      Mouth/Throat:     Pharynx: No oropharyngeal exudate.  Eyes:     Pupils: Pupils are equal, round, and reactive to light.  Neck:     Thyroid: No thyromegaly.     Vascular: No carotid bruit or JVD.     Trachea: No tracheal deviation.  Cardiovascular:     Rate and Rhythm: Normal rate and regular rhythm.     Heart sounds: Normal heart sounds. No murmur heard.  No friction rub. No gallop.   Pulmonary:     Effort: Pulmonary effort is normal. No respiratory distress.     Breath sounds: Normal breath sounds. No wheezing or rales.  Chest:     Chest wall: No tenderness.  Abdominal:     Palpations:  Abdomen is soft.  Musculoskeletal:        General: Normal range of motion.     Cervical back: Normal range of motion and neck supple.  Lymphadenopathy:     Cervical: No cervical adenopathy.  Skin:    General: Skin is warm and dry.     Capillary Refill: Capillary refill takes less than 2 seconds.  Neurological:     General: No focal deficit present.     Mental Status: She is alert and oriented to person, place, and time.     Cranial Nerves: No cranial nerve deficit.  Psychiatric:        Mood and Affect: Mood normal.        Behavior: Behavior normal.        Thought Content: Thought content normal.        Judgment: Judgment normal.    Assessment/Plan: 1. Essential hypertension Improved with increased dose of atenolol. Continue to monitor.   2. Chronic migraine with aura Sample aimovig 140mg  per month given to patient. Will try again to submit prescription for coverage.  - Erenumab-aooe (AIMOVIG) 140 MG/ML SOAJ; Inject 140 mg into the skin every 30 (thirty) days.  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  - zolmitriptan (ZOMIG) 5 MG tablet; Take 1 tablet (5 mg total) by mouth as needed for migraine.  Dispense: 30 tablet; Refill: 1  4. Moderate obesity Encouraged patient to  consume a 1200-1500 calories per day and gradually incorporate exercise into daily routine.    General Counseling: Kiva verbalizes understanding of the findings of todays visit and agrees with plan of treatment. I have discussed any further diagnostic evaluation that may be needed or ordered today. We also reviewed her medications today. she has been encouraged to call the office with any questions or concerns that should arise related to todays visit.   This patient was seen by [place] with Dr Lavera Guise as a part of collaborative care agreement  Meds ordered this encounter  Medications  . Erenumab-aooe (AIMOVIG) 140 MG/ML SOAJ    Sig: Inject 140 mg into the skin every 30 (thirty) days.    Dispense:  3 mL    Refill:  3    Patient has been given samples in past. Works very well for her. Has few, if any migraines. Has tried and failed multiple preventive meds including topamax, beta blockers, and calcium channel blockers.    Order Specific Question:   Supervising Provider    Answer:   Lavera Guise [[id number]]  . zolmitriptan (ZOMIG) 5 MG tablet    Sig: Take 1 tablet (5 mg total) by mouth as needed for migraine.    Dispense:  30 tablet    Refill:  1    Patient has failed imitrex and relpax    Order Specific Question:   Supervising Provider    Answer:   Lavera Guise [[id number]]    Total time spent: 25 Minutes Time spent includes review of chart, medications, test results, and follow up plan with the patient.      Dr Lavera Guise Internal medicine

## 2020-02-28 DIAGNOSIS — G43111 Migraine with aura, intractable, with status migrainosus: Secondary | ICD-10-CM | POA: Insufficient documentation

## 2020-03-16 ENCOUNTER — Telehealth: Payer: Self-pay

## 2020-03-23 ENCOUNTER — Other Ambulatory Visit: Payer: Self-pay | Admitting: Internal Medicine

## 2020-03-23 DIAGNOSIS — Z0001 Encounter for general adult medical examination with abnormal findings: Secondary | ICD-10-CM

## 2020-03-24 ENCOUNTER — Other Ambulatory Visit: Payer: Self-pay | Admitting: Nurse Practitioner

## 2020-03-24 ENCOUNTER — Telehealth: Payer: Self-pay

## 2020-03-24 DIAGNOSIS — F411 Generalized anxiety disorder: Secondary | ICD-10-CM

## 2020-03-24 MED ORDER — ALPRAZOLAM 0.25 MG PO TABS: 0.2500 mg | ORAL_TABLET | Freq: Every evening | ORAL | 0 refills | Status: DC | PRN

## 2020-03-24 NOTE — Telephone Encounter (Signed)
I'm so sorry. I just sent this to [facility] for her.

## 2020-03-28 NOTE — Telephone Encounter (Signed)
Heather sent refill to pharmacy

## 2020-06-07 ENCOUNTER — Ambulatory Visit: Payer: Managed Care, Other (non HMO) | Admitting: Hospice and Palliative Medicine

## 2020-06-30 ENCOUNTER — Other Ambulatory Visit: Payer: Self-pay | Admitting: Internal Medicine

## 2020-06-30 DIAGNOSIS — I10 Essential (primary) hypertension: Secondary | ICD-10-CM

## 2020-07-28 ENCOUNTER — Other Ambulatory Visit: Payer: Self-pay | Admitting: Internal Medicine

## 2020-07-28 DIAGNOSIS — I10 Essential (primary) hypertension: Secondary | ICD-10-CM

## 2020-07-28 NOTE — Telephone Encounter (Signed)
Pt need appt  For refills

## 2020-08-31 ENCOUNTER — Other Ambulatory Visit: Payer: Self-pay | Admitting: Internal Medicine

## 2020-08-31 DIAGNOSIS — I10 Essential (primary) hypertension: Secondary | ICD-10-CM

## 2020-09-12 ENCOUNTER — Other Ambulatory Visit: Payer: Self-pay

## 2020-09-12 DIAGNOSIS — I10 Essential (primary) hypertension: Secondary | ICD-10-CM

## 2020-09-12 MED ORDER — ATENOLOL 50 MG PO TABS: 50.0000 mg | ORAL_TABLET | Freq: Every day | ORAL | 0 refills | Status: DC

## 2020-09-16 ENCOUNTER — Ambulatory Visit: Payer: Managed Care, Other (non HMO) | Admitting: Nurse Practitioner

## 2020-09-19 ENCOUNTER — Ambulatory Visit: Payer: Managed Care, Other (non HMO) | Admitting: Nurse Practitioner

## 2020-09-21 ENCOUNTER — Telehealth: Payer: Self-pay

## 2020-09-21 NOTE — Telephone Encounter (Signed)
Lvm to reschedule 09/19/20 missed appointment-Toni

## 2020-09-28 ENCOUNTER — Telehealth: Payer: Self-pay

## 2020-09-28 NOTE — Telephone Encounter (Signed)
PA request for Aimovig 140MG /ML Auto injectors, pt was last seen by [facility] February 08, 2020, pt has no showed or canceled 3 times (06-07-20, 09-16-20, 09-19-20) and is scheduled for CPE 12-29-20, pt can discuss med at next visit

## 2020-10-03 ENCOUNTER — Telehealth: Payer: Self-pay

## 2020-10-03 NOTE — Telephone Encounter (Signed)
LMOM to discuss meds at next visit, pt can make sooner appt if needed

## 2020-10-19 ENCOUNTER — Other Ambulatory Visit: Payer: Self-pay

## 2020-10-19 ENCOUNTER — Encounter: Payer: Self-pay | Admitting: Nurse Practitioner

## 2020-10-19 ENCOUNTER — Ambulatory Visit: Payer: Managed Care, Other (non HMO) | Admitting: Nurse Practitioner

## 2020-10-19 VITALS — BP 139/85 | HR 68 | Temp 98.0°F | Resp 16 | Ht 65.0 in | Wt 225.4 lb

## 2020-10-19 DIAGNOSIS — Z1211 Encounter for screening for malignant neoplasm of colon: Secondary | ICD-10-CM | POA: Diagnosis not present

## 2020-10-19 DIAGNOSIS — F411 Generalized anxiety disorder: Secondary | ICD-10-CM

## 2020-10-19 DIAGNOSIS — Z23 Encounter for immunization: Secondary | ICD-10-CM

## 2020-10-19 DIAGNOSIS — I10 Essential (primary) hypertension: Secondary | ICD-10-CM | POA: Diagnosis not present

## 2020-10-19 DIAGNOSIS — Z1212 Encounter for screening for malignant neoplasm of rectum: Secondary | ICD-10-CM

## 2020-10-19 DIAGNOSIS — G43109 Migraine with aura, not intractable, without status migrainosus: Secondary | ICD-10-CM

## 2020-10-19 MED ORDER — AMLODIPINE BESYLATE 5 MG PO TABS: 5.0000 mg | ORAL_TABLET | Freq: Every day | ORAL | 2 refills | Status: DC

## 2020-10-19 MED ORDER — TETANUS-DIPHTH-ACELL PERTUSSIS 5-2-15.5 LF-MCG/0.5 IM SUSP: 0.5000 mL | Freq: Once | INTRAMUSCULAR | 0 refills | Status: AC

## 2020-10-19 NOTE — Progress Notes (Signed)
[facility] [place], [place] [address]  Internal MEDICINE  Office Visit Note  Patient Name: Barbara Stephens  [id number]  [id number]  Date of Service: 10/19/2020  Chief Complaint  Patient presents with   Follow-up    Med review, refills   Hypertension   Quality Metric Gaps    colonoscopy    HPI Barbara Stephens presents for a follow up visit for medication review, and refills. She has a history of migraines and hypertension. She has no other significant past medical history and her only surgical history is a C-section. She takes Aimovig for migraine prevention and states this has worked well thus far. She takes zolmitriptan to abort an active migraine.  -she is on atenolol for hypertension but it is not adequately controlling her blood pressure. BP rechecked, see vitals.  -She is due for colorectal cancer screening. She does not have any family history of colorectal cancer and would prefer to do the cologard stool test.  Aimovig samples Ubrelvy, samples.  Cologard send order  Bp rechecked, see vitals Stop atenolol Start amlo    Current Medication: Outpatient Encounter Medications as of 10/19/2020  Medication Sig   ALPRAZolam (XANAX) 0.25 MG tablet Take 1 tablet (0.25 mg total) by mouth at bedtime as needed for anxiety.   amLODipine (NORVASC) 5 MG tablet Take 1 tablet (5 mg total) by mouth daily.   busPIRone (BUSPAR) 10 MG tablet Take 1 tablet (10 mg total) by mouth 2 (two) times daily.   norethindrone-ethinyl estradiol (LOESTRIN) 1-20 MG-MCG tablet Take 1 tablet by mouth daily after supper.    zolmitriptan (ZOMIG) 5 MG tablet Take 1 tablet (5 mg total) by mouth as needed for migraine.   [DISCONTINUED] atenolol (TENORMIN) 50 MG tablet Take 1 tablet (50 mg total) by mouth daily.   [DISCONTINUED] Erenumab-aooe (AIMOVIG) 140 MG/ML SOAJ Inject 140 mg into the skin every 30 (thirty) days.   [DISCONTINUED] Tdap (ADACEL) 08-08-13.5 LF-MCG/0.5 injection Inject 0.5 mLs  into the muscle once.   Erenumab-aooe (AIMOVIG) 140 MG/ML SOAJ Inject 140 mg into the skin every 30 (thirty) days.   [EXPIRED] Tdap (ADACEL) 08-08-13.5 LF-MCG/0.5 injection Inject 0.5 mLs into the muscle once for 1 dose.   No facility-administered encounter medications on file as of 10/19/2020.    Surgical History: Past Surgical History:  Procedure Laterality Date   CESAREAN SECTION      Medical History: Past Medical History:  Diagnosis Date   Hypertension    Migraines    Premenopausal patient     Family History: Family History  Problem Relation Age of Onset   Thyroid disease Mother    Heart disease Mother    Lung cancer Father    Diabetes Father     Social History   Socioeconomic History   Marital status: Legally Separated    Spouse name: Not on file   Number of children: Not on file   Years of education: Not on file   Highest education level: Not on file  Occupational History   Not on file  Tobacco Use   Smoking status: Former    Types: Cigarettes   Smokeless tobacco: Never  Vaping Use   Vaping Use: Former  Substance and Sexual Activity   Alcohol use: Yes    Comment: occasionally   Drug use: Never   Sexual activity: Not on file  Other Topics Concern   Not on file  Social History Narrative   Not on file   Social Determinants of Health  Financial Resource Strain: Not on file  Food Insecurity: Not on file  Transportation Needs: Not on file  Physical Activity: Not on file  Stress: Not on file  Social Connections: Not on file  Intimate Partner Violence: Not on file      Review of Systems  Constitutional:  Negative for chills, fatigue and unexpected weight change.  HENT:  Negative for congestion, rhinorrhea, sneezing and sore throat.   Eyes:  Negative for redness.  Respiratory:  Negative for cough, chest tightness and shortness of breath.   Cardiovascular:  Negative for chest pain and palpitations.  Gastrointestinal:  Negative for abdominal pain,  constipation, diarrhea, nausea and vomiting.  Genitourinary:  Negative for dysuria and frequency.  Musculoskeletal:  Negative for arthralgias, back pain, joint swelling and neck pain.  Skin:  Negative for rash.  Neurological:  Positive for headaches (chronic migraines). Negative for dizziness, tremors, light-headedness and numbness.  Hematological:  Negative for adenopathy. Does not bruise/bleed easily.  Psychiatric/Behavioral:  Negative for behavioral problems (Depression), sleep disturbance and suicidal ideas. The patient is not nervous/anxious.    Vital Signs: BP 139/85 Comment: 134/93  Pulse 68   Temp 98 F (36.7 C)   Resp 16   Ht 5\' 5"  (1.651 m)   Wt 225 lb 6.4 oz (102.2 kg)   SpO2 98%   BMI 37.51 kg/m    Physical Exam Vitals reviewed.  Constitutional:      General: She is not in acute distress.    Appearance: Normal appearance. She is obese. She is not ill-appearing.  HENT:     Head: Normocephalic and atraumatic.  Cardiovascular:     Rate and Rhythm: Normal rate and regular rhythm.     Pulses: Normal pulses.     Heart sounds: Normal heart sounds. No murmur heard. Pulmonary:     Effort: Pulmonary effort is normal. No respiratory distress.     Breath sounds: Normal breath sounds. No wheezing.  Skin:    Capillary Refill: Capillary refill takes less than 2 seconds.  Neurological:     Mental Status: She is alert and oriented to person, place, and time.  Psychiatric:        Mood and Affect: Mood normal.        Behavior: Behavior normal.    Assessment/Plan: 1. Chronic migraine with aura Patient has been having difficulty with the prior authorization for Aimovig. Aimovig samples for 2 months provided in office today. 4 doses of Ubrelvy samples were also provided for the patient to try as an alternative to zolmitriptan. Will send a prescription if she decides that it works better for her than zolmitriptan. Will resend the order for Aimovig as well.  Previous medications  that have been prescribed include emgality, multihance, sumatriptan oral and injection, topiramate, maxalt, eletriptan, atenolol, zolmitriptan and a calcium channel blocker.  - Erenumab-aooe (AIMOVIG) 140 MG/ML SOAJ; Inject 140 mg into the skin every 30 (thirty) days.  Dispense: 3 mL; Refill: 3  2. Essential hypertension Atenolol is not an optimal first-line medication for hypertension. Beta blockers can also be used for migraines but the patient reports that this medication has not been helping her migraines. It is also not controlling her blood pressure as well as it should be. Atenolol discontinued. Start amlodipine 5 mg daily. Follow up in 4 weeks to evaluate effectiveness of medication adjustment.  - amLODipine (NORVASC) 5 MG tablet; Take 1 tablet (5 mg total) by mouth daily.  Dispense: 30 tablet; Refill: 2  3. Screening for colorectal  cancer Will order cologard test for patient.   4. Generalized anxiety disorder Stable with current medication.   5. Need for tetanus booster Order sent to pharmacy, patient instructed to go to pharmacy and their staff can administer it.  - Tdap (ADACEL) 08-08-13.5 LF-MCG/0.5 injection; Inject 0.5 mLs into the muscle once for 1 dose.  Dispense: 0.5 mL; Refill: 0   General Counseling: Barbara Stephens verbalizes understanding of the findings of todays visit and agrees with plan of treatment. I have discussed any further diagnostic evaluation that may be needed or ordered today. We also reviewed her medications today. she has been encouraged to call the office with any questions or concerns that should arise related to todays visit.    No orders of the defined types were placed in this encounter.   Meds ordered this encounter  Medications   Tdap (ADACEL) 08-08-13.5 LF-MCG/0.5 injection    Sig: Inject 0.5 mLs into the muscle once for 1 dose.    Dispense:  0.5 mL    Refill:  0   amLODipine (NORVASC) 5 MG tablet    Sig: Take 1 tablet (5 mg total) by mouth daily.     Dispense:  30 tablet    Refill:  2   Erenumab-aooe (AIMOVIG) 140 MG/ML SOAJ    Sig: Inject 140 mg into the skin every 30 (thirty) days.    Dispense:  3 mL    Refill:  3    Patient has been given samples in past. Works very well for her. Has few, if any migraines. Has tried and failed multiple preventive meds including topamax, beta blockers, and calcium channel blockers.    Return in about 1 month (around 11/19/2020) for F/U, BP check, Daneli Butkiewicz PCP.   Total time spent:30 Minutes Time spent includes review of chart, medications, test results, and follow up plan with the patient.   [place] Controlled Substance Database was reviewed by me.  This patient was seen by Jonetta Osgood, FNP-C in collaboration with Dr. Clayborn Bigness as a part of collaborative care agreement.   Bertel Venard R. Valetta Fuller, MSN, FNP-C Internal medicine

## 2020-10-29 MED ORDER — AIMOVIG 140 MG/ML ~~LOC~~ SOAJ: 140.0000 mg | SUBCUTANEOUS | 3 refills | Status: DC

## 2020-11-01 ENCOUNTER — Telehealth: Payer: Self-pay

## 2020-11-01 NOTE — Telephone Encounter (Signed)
PA for AIMOVIG 140 MG Injectors sent to covermymeds 5:34 pm on 11/01/20

## 2020-11-02 ENCOUNTER — Other Ambulatory Visit: Payer: Self-pay | Admitting: Nurse Practitioner

## 2020-11-02 DIAGNOSIS — G43109 Migraine with aura, not intractable, without status migrainosus: Secondary | ICD-10-CM

## 2020-11-04 ENCOUNTER — Telehealth: Payer: Self-pay

## 2020-11-04 NOTE — Telephone Encounter (Signed)
PA for AIMOVIG 140 mg/ml was denied.  Will get with provider to see what they want to do, either appeal PA or change medication

## 2020-11-09 ENCOUNTER — Telehealth: Payer: Self-pay

## 2020-11-09 NOTE — Telephone Encounter (Signed)
Cologuard requisition faxed on 11/09/2020

## 2020-11-11 NOTE — Telephone Encounter (Signed)
PA was denied

## 2020-11-24 ENCOUNTER — Other Ambulatory Visit: Payer: Self-pay

## 2020-11-24 ENCOUNTER — Encounter: Payer: Self-pay | Admitting: Nurse Practitioner

## 2020-11-24 ENCOUNTER — Ambulatory Visit: Payer: Managed Care, Other (non HMO) | Admitting: Nurse Practitioner

## 2020-11-24 VITALS — BP 130/80 | HR 100 | Temp 97.6°F | Resp 16 | Ht 65.0 in | Wt 220.0 lb

## 2020-11-24 DIAGNOSIS — F411 Generalized anxiety disorder: Secondary | ICD-10-CM | POA: Diagnosis not present

## 2020-11-24 DIAGNOSIS — I10 Essential (primary) hypertension: Secondary | ICD-10-CM

## 2020-11-24 DIAGNOSIS — G43109 Migraine with aura, not intractable, without status migrainosus: Secondary | ICD-10-CM

## 2020-11-24 MED ORDER — UBRELVY 100 MG PO TABS: 100.0000 mg | ORAL_TABLET | Freq: Every day | ORAL | 2 refills | Status: DC | PRN

## 2020-11-24 MED ORDER — BUSPIRONE HCL 10 MG PO TABS: 10.0000 mg | ORAL_TABLET | Freq: Two times a day (BID) | ORAL | 3 refills | Status: DC

## 2020-11-24 MED ORDER — ALPRAZOLAM 0.25 MG PO TABS: 0.2500 mg | ORAL_TABLET | Freq: Every evening | ORAL | 2 refills | Status: DC | PRN

## 2020-11-24 MED ORDER — METOPROLOL SUCCINATE ER 25 MG PO TB24: 25.0000 mg | ORAL_TABLET | Freq: Every day | ORAL | 0 refills | Status: DC

## 2020-11-24 NOTE — Progress Notes (Signed)
[facility] [place], [place] [address]  Internal MEDICINE  Office Visit Note  Patient Name: Barbara Stephens  [id number]  [id number]  Date of Service: 11/24/2020  Chief Complaint  Patient presents with   Follow-up    Discuss meds   Hypertension   Quality Metric Gaps    Colonoscopy     HPI Barbara Stephens presents for a follow up visit for hypertension. At her previous office visit, she was started on amlodipine for hypertension. Her systolic and diastolic blood pressures are both elevated by the diastolic blood pressure elevation is more concerning. She did not tolerate amlodipine due to muscle aches and weakness as well as tachycardia up to 165. She stopping taking the medication a couple of days prior to this office visit.  -tried Roselyn Meier and it helps. Wants a prescription. Requesting a sample of Aimovig since she is still waiting for prior authorization.    Current Medication: Outpatient Encounter Medications as of 11/24/2020  Medication Sig   Erenumab-aooe (AIMOVIG) 140 MG/ML SOAJ Inject 140 mg into the skin every 30 (thirty) days.   metoprolol succinate (TOPROL-XL) 25 MG 24 hr tablet Take 1 tablet (25 mg total) by mouth daily.   norethindrone-ethinyl estradiol (LOESTRIN) 1-20 MG-MCG tablet Take 1 tablet by mouth daily after supper.    Ubrogepant (UBRELVY) 100 MG TABS Take 100 mg by mouth daily as needed (acute migraine).   zolmitriptan (ZOMIG) 5 MG tablet Take 1 tablet (5 mg total) by mouth as needed for migraine.   [DISCONTINUED] ALPRAZolam (XANAX) 0.25 MG tablet Take 1 tablet (0.25 mg total) by mouth at bedtime as needed for anxiety.   [DISCONTINUED] amLODipine (NORVASC) 5 MG tablet Take 1 tablet (5 mg total) by mouth daily.   [DISCONTINUED] busPIRone (BUSPAR) 10 MG tablet Take 1 tablet (10 mg total) by mouth 2 (two) times daily.   ALPRAZolam (XANAX) 0.25 MG tablet Take 1 tablet (0.25 mg total) by mouth at bedtime as needed for anxiety.   busPIRone (BUSPAR) 10  MG tablet Take 1 tablet (10 mg total) by mouth 2 (two) times daily.   No facility-administered encounter medications on file as of 11/24/2020.    Surgical History: Past Surgical History:  Procedure Laterality Date   CESAREAN SECTION      Medical History: Past Medical History:  Diagnosis Date   Hypertension    Migraines    Premenopausal patient     Family History: Family History  Problem Relation Age of Onset   Thyroid disease Mother    Heart disease Mother    Lung cancer Father    Diabetes Father     Social History   Socioeconomic History   Marital status: Legally Separated    Spouse name: Not on file   Number of children: Not on file   Years of education: Not on file   Highest education level: Not on file  Occupational History   Not on file  Tobacco Use   Smoking status: Former    Types: Cigarettes   Smokeless tobacco: Never  Vaping Use   Vaping Use: Former  Substance and Sexual Activity   Alcohol use: Yes    Comment: occasionally   Drug use: Never   Sexual activity: Not on file  Other Topics Concern   Not on file  Social History Narrative   Not on file   Social Determinants of Health   Financial Resource Strain: Not on file  Food Insecurity: Not on file  Transportation Needs: Not on  file  Physical Activity: Not on file  Stress: Not on file  Social Connections: Not on file  Intimate Partner Violence: Not on file      Review of Systems  Constitutional:  Negative for chills, diaphoresis, fatigue, fever and unexpected weight change.  HENT:  Negative for congestion, rhinorrhea, sneezing and sore throat.   Eyes:  Negative for redness.  Respiratory:  Negative for cough, chest tightness and shortness of breath.   Cardiovascular:  Negative for chest pain and palpitations.       Tachycardia with amlodipine as high as 165  Gastrointestinal:  Negative for abdominal pain, constipation, diarrhea, nausea and vomiting.  Genitourinary:  Negative for dysuria  and frequency.  Musculoskeletal:  Negative for arthralgias, back pain, joint swelling and neck pain.  Skin:  Negative for rash.  Neurological:  Positive for headaches. Negative for dizziness, tremors, light-headedness and numbness.  Hematological:  Negative for adenopathy. Does not bruise/bleed easily.  Psychiatric/Behavioral:  Negative for behavioral problems (Depression), sleep disturbance and suicidal ideas. The patient is not nervous/anxious.    Vital Signs: BP 130/80 Comment: 130/100  Pulse 100   Temp 97.6 F (36.4 C)   Resp 16   Ht '5\' 5"'$  (1.651 m)   Wt 220 lb (99.8 kg)   SpO2 97%   BMI 36.61 kg/m    Physical Exam Vitals reviewed.  Constitutional:      General: She is not in acute distress.    Appearance: Normal appearance. She is well-developed. She is obese. She is not ill-appearing or diaphoretic.  HENT:     Head: Normocephalic and atraumatic.  Neck:     Thyroid: No thyromegaly.     Vascular: No JVD.     Trachea: No tracheal deviation.  Cardiovascular:     Rate and Rhythm: Normal rate and regular rhythm.     Heart sounds: Normal heart sounds. No murmur heard. Pulmonary:     Effort: Pulmonary effort is normal. No respiratory distress.     Breath sounds: Normal breath sounds. No wheezing.  Skin:    General: Skin is warm and dry.     Capillary Refill: Capillary refill takes less than 2 seconds.  Neurological:     Mental Status: She is alert and oriented to person, place, and time.  Psychiatric:        Mood and Affect: Mood normal.        Behavior: Behavior normal.     Assessment/Plan: 1. Essential hypertension Amlodipine discontinued, start metoprolol succinate 25 mg daily.  - metoprolol succinate (TOPROL-XL) 25 MG 24 hr tablet; Take 1 tablet (25 mg total) by mouth daily.  Dispense: 30 tablet; Refill: 0  2. Chronic migraine with aura 1 month Aimovig sample provided to patient, will check on prior authorization. Roselyn Meier prescription sent to [facility]  specialty pharmacy.  - Ubrogepant (UBRELVY) 100 MG TABS; Take 100 mg by mouth daily as needed (acute migraine).  Dispense: 16 tablet; Refill: 2  3. Generalized anxiety disorder Anxiety is still well controlled, refills sent to pharmacy.  - ALPRAZolam (XANAX) 0.25 MG tablet; Take 1 tablet (0.25 mg total) by mouth at bedtime as needed for anxiety.  Dispense: 30 tablet; Refill: 2 - busPIRone (BUSPAR) 10 MG tablet; Take 1 tablet (10 mg total) by mouth 2 (two) times daily.  Dispense: 60 tablet; Refill: 3    General Counseling: Nahdia verbalizes understanding of the findings of todays visit and agrees with plan of treatment. I have discussed any further diagnostic evaluation that may be  needed or ordered today. We also reviewed her medications today. she has been encouraged to call the office with any questions or concerns that should arise related to todays visit.    No orders of the defined types were placed in this encounter.   Meds ordered this encounter  Medications   metoprolol succinate (TOPROL-XL) 25 MG 24 hr tablet    Sig: Take 1 tablet (25 mg total) by mouth daily.    Dispense:  30 tablet    Refill:  0   ALPRAZolam (XANAX) 0.25 MG tablet    Sig: Take 1 tablet (0.25 mg total) by mouth at bedtime as needed for anxiety.    Dispense:  30 tablet    Refill:  2   busPIRone (BUSPAR) 10 MG tablet    Sig: Take 1 tablet (10 mg total) by mouth 2 (two) times daily.    Dispense:  60 tablet    Refill:  3   Ubrogepant (UBRELVY) 100 MG TABS    Sig: Take 100 mg by mouth daily as needed (acute migraine).    Dispense:  16 tablet    Refill:  2    Return in about 5 weeks (around 12/29/2020) for CPE, BP check, Yalissa Fink PCP. Previously scheduled.    Total time spent:20 Minutes Time spent includes review of chart, medications, test results, and follow up plan with the patient.   [place] Controlled Substance Database was reviewed by me.  This patient was seen by Jonetta Osgood, FNP-C in collaboration  with Dr. Clayborn Bigness as a part of collaborative care agreement.   Ottis Vacha R. Valetta Fuller, MSN, FNP-C Internal medicine

## 2020-12-04 ENCOUNTER — Other Ambulatory Visit: Payer: Self-pay | Admitting: Internal Medicine

## 2020-12-04 DIAGNOSIS — I10 Essential (primary) hypertension: Secondary | ICD-10-CM

## 2020-12-07 ENCOUNTER — Telehealth: Payer: Self-pay

## 2020-12-07 NOTE — Telephone Encounter (Signed)
PA for AIMOVIG 140 mg injection sent 12/07/20 at 308pm  Spoke to pt and she has already received her medication for UBRELVY thru [place].  Pt wants me to still try to get the AIMOVIG approved, she states it helps and has been using for a while.    Previous medications that have been prescribed include emgality, multihance, sumatriptan oral and injection, topiramate, maxalt, eletriptan, atenolol, zolmitriptan and a calcium channel blocker.

## 2020-12-09 NOTE — Telephone Encounter (Signed)
error 

## 2020-12-16 ENCOUNTER — Other Ambulatory Visit: Payer: Self-pay | Admitting: Nurse Practitioner

## 2020-12-16 DIAGNOSIS — F411 Generalized anxiety disorder: Secondary | ICD-10-CM

## 2020-12-16 DIAGNOSIS — I10 Essential (primary) hypertension: Secondary | ICD-10-CM

## 2020-12-22 ENCOUNTER — Other Ambulatory Visit: Payer: Self-pay

## 2020-12-22 DIAGNOSIS — G43109 Migraine with aura, not intractable, without status migrainosus: Secondary | ICD-10-CM

## 2020-12-22 MED ORDER — AIMOVIG 140 MG/ML ~~LOC~~ SOAJ: 140.0000 mg | SUBCUTANEOUS | 3 refills | Status: DC

## 2020-12-29 ENCOUNTER — Other Ambulatory Visit: Payer: Self-pay

## 2020-12-29 ENCOUNTER — Telehealth: Payer: Self-pay

## 2020-12-29 ENCOUNTER — Ambulatory Visit (INDEPENDENT_AMBULATORY_CARE_PROVIDER_SITE_OTHER): Payer: Managed Care, Other (non HMO) | Admitting: Nurse Practitioner

## 2020-12-29 ENCOUNTER — Encounter: Payer: Self-pay | Admitting: Nurse Practitioner

## 2020-12-29 VITALS — BP 130/70 | HR 80 | Temp 98.7°F | Resp 16 | Ht 65.0 in | Wt 220.2 lb

## 2020-12-29 DIAGNOSIS — G43111 Migraine with aura, intractable, with status migrainosus: Secondary | ICD-10-CM | POA: Diagnosis not present

## 2020-12-29 DIAGNOSIS — E782 Mixed hyperlipidemia: Secondary | ICD-10-CM | POA: Diagnosis not present

## 2020-12-29 DIAGNOSIS — R300 Dysuria: Secondary | ICD-10-CM

## 2020-12-29 DIAGNOSIS — R7301 Impaired fasting glucose: Secondary | ICD-10-CM

## 2020-12-29 DIAGNOSIS — I10 Essential (primary) hypertension: Secondary | ICD-10-CM

## 2020-12-29 DIAGNOSIS — D352 Benign neoplasm of pituitary gland: Secondary | ICD-10-CM

## 2020-12-29 DIAGNOSIS — Z1211 Encounter for screening for malignant neoplasm of colon: Secondary | ICD-10-CM

## 2020-12-29 DIAGNOSIS — Z1231 Encounter for screening mammogram for malignant neoplasm of breast: Secondary | ICD-10-CM

## 2020-12-29 DIAGNOSIS — Z6836 Body mass index (BMI) 36.0-36.9, adult: Secondary | ICD-10-CM

## 2020-12-29 DIAGNOSIS — Z1212 Encounter for screening for malignant neoplasm of rectum: Secondary | ICD-10-CM

## 2020-12-29 DIAGNOSIS — E6609 Other obesity due to excess calories: Secondary | ICD-10-CM

## 2020-12-29 DIAGNOSIS — Z0001 Encounter for general adult medical examination with abnormal findings: Secondary | ICD-10-CM

## 2020-12-29 DIAGNOSIS — E559 Vitamin D deficiency, unspecified: Secondary | ICD-10-CM

## 2020-12-29 LAB — POCT GLYCOSYLATED HEMOGLOBIN (HGB A1C): Hemoglobin A1C: 5.4 % (ref 4.0–5.6)

## 2020-12-29 MED ORDER — MOUNJARO 5 MG/0.5ML ~~LOC~~ SOAJ: 5.0000 mg | SUBCUTANEOUS | 2 refills | Status: DC

## 2020-12-29 NOTE — Telephone Encounter (Signed)
CALLED PATIENT NO ANSWER LEFT VOICEMAIL FOR A CALL BACK ? ?

## 2020-12-29 NOTE — Progress Notes (Signed)
[facility] [place], [place] [address]  Internal MEDICINE  Office Visit Note  Patient Name: Barbara Stephens  [id number]  [id number]  Date of Service: 12/29/2020  Chief Complaint  Patient presents with   Annual Exam    Discuss meds   Hypertension   Quality Metric Gaps    colonoscopy    HPI Barbara Stephens presents for an annual well visit and physical exam. she has a history of migraines and hypertension. She is due for colorectal cancer screenings and has decided to have the routine colonoscopy done. She is also due for her screening mammogram. She has not received the COVID vaccine. She denies any pain. Asking for aimovig samples.   Barbara Stephens as been having migraines since she was 45 years old. She has 2-3 migraines per week if she is not on any medication, which is approximately 12-15 migraine days per month. She has an aura with her migraines as well as photosensitivity and sensitivity to sound. Her migraines last several hours without medication and when she does take medication she often has to take an additional dose or 2 different abortive medications. She uses ice packs and rest as nonpharmacological interventions but this does not really help. Other medications she has tried include atenolol, topiramate, zolmitriptan, eletriptan, rizatriptan, oral sumatriptan, nasal sumatriptan, and sumatriptan injection, emgality and is now currently on Aimovig. 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   The 10-year ASCVD risk score (Arnett DK, et al., 2019) is: 2%   Values used to calculate the score:     Age: 45 years     Sex: Female     Is Non-Hispanic African American: No     Diabetic: No     Tobacco smoker: No     Systolic Blood Pressure: 163 mmHg     Is BP treated: Yes     HDL Cholesterol: 40 mg/dL     Total Cholesterol: 205 mg/dL    Current Medication: Outpatient Encounter Medications as of 12/29/2020  Medication Sig   ALPRAZolam (XANAX)  0.25 MG tablet Take 1 tablet (0.25 mg total) by mouth at bedtime as needed for anxiety.   busPIRone (BUSPAR) 10 MG tablet TAKE 1 TABLET BY MOUTH TWICE A DAY   Erenumab-aooe (AIMOVIG) 140 MG/ML SOAJ Inject 140 mg into the skin every 30 (thirty) days.   metoprolol succinate (TOPROL-XL) 25 MG 24 hr tablet TAKE 1 TABLET (25 MG TOTAL) BY MOUTH DAILY.   Ubrogepant (UBRELVY) 100 MG TABS Take 100 mg by mouth daily as needed (acute migraine).   zolmitriptan (ZOMIG) 5 MG tablet Take 1 tablet (5 mg total) by mouth as needed for migraine.   norethindrone-ethinyl estradiol (LOESTRIN) 1-20 MG-MCG tablet Take 1 tablet by mouth daily after supper.    [DISCONTINUED] busPIRone (BUSPAR) 10 MG tablet Take 1 tablet (10 mg total) by mouth 2 (two) times daily.   [DISCONTINUED] Erenumab-aooe (AIMOVIG) 140 MG/ML SOAJ Inject 140 mg into the skin every 30 (thirty) days.   [DISCONTINUED] metoprolol succinate (TOPROL-XL) 25 MG 24 hr tablet Take 1 tablet (25 mg total) by mouth daily.   No facility-administered encounter medications on file as of 12/29/2020.    Surgical History: Past Surgical History:  Procedure Laterality Date   CESAREAN SECTION      Medical History: Past Medical History:  Diagnosis Date   Hypertension    Migraines    Premenopausal patient     Family History: Family History  Problem Relation Age of  Onset   Thyroid disease Mother    Heart disease Mother    Lung cancer Father    Diabetes Father     Social History   Socioeconomic History   Marital status: Legally Separated    Spouse name: Not on file   Number of children: Not on file   Years of education: Not on file   Highest education level: Not on file  Occupational History   Not on file  Tobacco Use   Smoking status: Former    Types: Cigarettes   Smokeless tobacco: Never  Vaping Use   Vaping Use: Former  Substance and Sexual Activity   Alcohol use: Yes    Comment: occasionally   Drug use: Never   Sexual activity: Not on  file  Other Topics Concern   Not on file  Social History Narrative   Not on file   Social Determinants of Health   Financial Resource Strain: Not on file  Food Insecurity: Not on file  Transportation Needs: Not on file  Physical Activity: Not on file  Stress: Not on file  Social Connections: Not on file  Intimate Partner Violence: Not on file      Review of Systems  Constitutional:  Negative for activity change, appetite change, chills, fatigue, fever and unexpected weight change.  HENT: Negative.  Negative for congestion, ear pain, rhinorrhea, sore throat and trouble swallowing.        Sensitivity to sound associated with migraines  Eyes:  Positive for photophobia (associated with migraines).  Respiratory: Negative.  Negative for cough, chest tightness, shortness of breath and wheezing.   Cardiovascular: Negative.  Negative for chest pain.  Gastrointestinal:  Positive for nausea (associated with migraines). Negative for abdominal pain, blood in stool, constipation, diarrhea and vomiting.  Endocrine: Negative.   Genitourinary: Negative.  Negative for difficulty urinating, dysuria, frequency, hematuria and urgency.  Musculoskeletal: Negative.  Negative for arthralgias, back pain, joint swelling, myalgias and neck pain.  Skin: Negative.  Negative for rash and wound.  Allergic/Immunologic: Negative.  Negative for immunocompromised state.  Neurological:  Positive for dizziness (associated with migraines) and headaches. Negative for seizures and numbness.  Hematological: Negative.   Psychiatric/Behavioral: Negative.  Negative for behavioral problems, self-injury and suicidal ideas. The patient is not nervous/anxious.    Vital Signs: BP 130/70 Comment: 140/84  Pulse 80   Temp 98.7 F (37.1 C)   Resp 16   Ht 5' 5"  (1.651 m)   Wt 220 lb 3.2 oz (99.9 kg)   SpO2 98%   BMI 36.64 kg/m    Physical Exam Vitals reviewed.  Constitutional:      General: She is awake. She is not in  acute distress.    Appearance: Normal appearance. She is well-developed and well-groomed. She is obese. She is not ill-appearing or diaphoretic.  HENT:     Head: Normocephalic and atraumatic.     Right Ear: Tympanic membrane, ear canal and external ear normal.     Left Ear: Tympanic membrane, ear canal and external ear normal.     Nose: Nose normal. No congestion or rhinorrhea.     Mouth/Throat:     Lips: Pink.     Mouth: Mucous membranes are moist.     Pharynx: Oropharynx is clear. Uvula midline. No oropharyngeal exudate or posterior oropharyngeal erythema.  Eyes:     General: Lids are normal. Vision grossly intact. Gaze aligned appropriately. No scleral icterus.       Right eye: No discharge.  Left eye: No discharge.     Extraocular Movements: Extraocular movements intact.     Conjunctiva/sclera: Conjunctivae normal.     Pupils: Pupils are equal, round, and reactive to light.     Funduscopic exam:    Right eye: Red reflex present.        Left eye: Red reflex present. Neck:     Thyroid: No thyromegaly.     Vascular: No JVD.     Trachea: Trachea and phonation normal. No tracheal deviation.  Cardiovascular:     Rate and Rhythm: Normal rate and regular rhythm.     Pulses: Normal pulses.     Heart sounds: Normal heart sounds, S1 normal and S2 normal. No murmur heard.   No friction rub. No gallop.  Pulmonary:     Effort: Pulmonary effort is normal. No accessory muscle usage or respiratory distress.     Breath sounds: Normal breath sounds and air entry. No stridor. No wheezing or rales.  Chest:     Chest wall: No tenderness.     Comments: Declined clinical breast exam, mammogram ordered Abdominal:     General: Bowel sounds are normal. There is no distension.     Palpations: Abdomen is soft. There is no shifting dullness, fluid wave, mass or pulsatile mass.     Tenderness: There is no abdominal tenderness. There is no guarding or rebound.  Musculoskeletal:        General: No  tenderness or deformity. Normal range of motion.     Cervical back: Normal range of motion and neck supple.     Right lower leg: No edema.     Left lower leg: No edema.  Lymphadenopathy:     Cervical: No cervical adenopathy.  Skin:    General: Skin is warm and dry.     Capillary Refill: Capillary refill takes less than 2 seconds.     Coloration: Skin is not pale.     Findings: No erythema or rash.  Neurological:     Mental Status: She is alert and oriented to person, place, and time.     Cranial Nerves: No cranial nerve deficit.     Motor: No abnormal muscle tone.     Coordination: Coordination normal.     Gait: Gait normal.     Deep Tendon Reflexes: Reflexes are normal and symmetric.  Psychiatric:        Mood and Affect: Mood and affect normal.        Behavior: Behavior normal. Behavior is cooperative.        Thought Content: Thought content normal.        Judgment: Judgment normal.       Assessment/Plan: 1. Encounter for general adult medical examination with abnormal findings Age-appropriate preventive screenings and vaccinations discussed, annual physical exam completed. Routine labs for health maintenance ordered, see below. PHM updated. Mammogram and colonoscopy due.   2. Intractable migraine with aura with status migrainosus Aimovig reordered, will attempt prior authorization again, and will include this encounter note with assessment and additional information about her migraines.  - Erenumab-aooe (AIMOVIG) 140 MG/ML SOAJ; Inject 140 mg into the skin every 30 (thirty) days.  Dispense: 3 mL; Refill: 3  3. Impaired fasting glucose A1C is 5.4, labs ordered, mounjaro sample provided, prescription sent.  - POCT glycosylated hemoglobin (Hb A1C) - CMP14+EGFR - tirzepatide (MOUNJARO) 5 MG/0.5ML Pen; Inject 5 mg into the skin once a week.  Dispense: 2 mL; Refill: 2  4. Essential hypertension Blood pressure is  stable with current medications  5. Pituitary adenoma  ([place]) Labs ordered, followed by endocrinology - CBC with Differential/Platelet - Prolactin - TSH + free T4  6. Mixed hyperlipidemia Lipid panel ordered.  - Lipid Profile  7. Vitamin D deficiency Rule out low vitamin D - Vitamin D (25 hydroxy)  8. Screening for colorectal cancer Referred to GI four routine colonoscopy. - Ambulatory referral to Gastroenterology  9. Encounter for screening mammogram for malignant neoplasm of breast Routine mammogram ordered.  - MM DIGITAL SCREENING BILATERAL; Future  10. Dysuria Routine urinalysis done - UA/M w/rflx Culture, Routine  11. Class 2 obesity due to excess calories without serious comorbidity with body mass index (BMI) of 36.0 to 36.9 in adult Samples of Mounjaro provided, prescription sent.  Obesity Counseling: Risk Assessment: An assessment of behavioral risk factors was made today and includes lack of exercise sedentary lifestyle, lack of portion control and poor dietary habits.  Risk Modification Advice: She was counseled on portion control guidelines. Discussed decreasing high carb and high sugar foods, and increasing lean proteins in diet. The detrimental long term effects of obesity on her health and ongoing poor compliance was also discussed with the patient. - tirzepatide [facility]) 5 MG/0.5ML Pen; Inject 5 mg into the skin once a week.  Dispense: 2 mL; Refill: 2     General Counseling: Dynastie verbalizes understanding of the findings of todays visit and agrees with plan of treatment. I have discussed any further diagnostic evaluation that may be needed or ordered today. We also reviewed her medications today. she has been encouraged to call the office with any questions or concerns that should arise related to todays visit.    Orders Placed This Encounter  Procedures   MM DIGITAL SCREENING BILATERAL   CBC with Differential/Platelet   CMP14+EGFR   Prolactin   TSH + free T4   Vitamin D (25 hydroxy)   Lipid Profile    UA/M w/rflx Culture, Routine   Ambulatory referral to Gastroenterology   POCT glycosylated hemoglobin (Hb A1C)    No orders of the defined types were placed in this encounter.   Return in about 4 weeks (around 01/26/2021) for F/U, Weight loss, Vernisha Bacote PCP.   Total time spent:30 Minutes Time spent includes review of chart, medications, test results, and follow up plan with the patient.   [place] Controlled Substance Database was reviewed by me.  This patient was seen by Jonetta Osgood, FNP-C in collaboration with Dr. Clayborn Bigness as a part of collaborative care agreement.  Quentin Strebel R. Valetta Fuller, MSN, FNP-C Internal medicine

## 2020-12-30 ENCOUNTER — Other Ambulatory Visit: Payer: Self-pay | Admitting: Nurse Practitioner

## 2020-12-30 ENCOUNTER — Encounter: Payer: Self-pay | Admitting: Nurse Practitioner

## 2020-12-30 DIAGNOSIS — E6609 Other obesity due to excess calories: Secondary | ICD-10-CM

## 2020-12-30 DIAGNOSIS — Z6836 Body mass index (BMI) 36.0-36.9, adult: Secondary | ICD-10-CM

## 2020-12-30 DIAGNOSIS — R7301 Impaired fasting glucose: Secondary | ICD-10-CM

## 2020-12-30 LAB — CMP14+EGFR
ALT: 16 IU/L (ref 0–32)
AST: 17 IU/L (ref 0–40)
Albumin/Globulin Ratio: 1.9 (ref 1.2–2.2)
Albumin: 4.6 g/dL (ref 3.8–4.8)
Alkaline Phosphatase: 69 IU/L (ref 44–121)
BUN/Creatinine Ratio: 13 (ref 9–23)
BUN: 9 mg/dL (ref 6–24)
Bilirubin Total: 0.3 mg/dL (ref 0.0–1.2)
CO2: 22 mmol/L (ref 20–29)
Calcium: 9.3 mg/dL (ref 8.7–10.2)
Chloride: 101 mmol/L (ref 96–106)
Creatinine, Ser: 0.67 mg/dL (ref 0.57–1.00)
Globulin, Total: 2.4 g/dL (ref 1.5–4.5)
Glucose: 73 mg/dL (ref 65–99)
Potassium: 5.3 mmol/L — ABNORMAL HIGH (ref 3.5–5.2)
Sodium: 138 mmol/L (ref 134–144)
Total Protein: 7 g/dL (ref 6.0–8.5)
eGFR: 110 mL/min/{1.73_m2} (ref >59)

## 2020-12-30 LAB — CBC WITH DIFFERENTIAL/PLATELET
Basophils Absolute: 0.1 10*3/uL (ref 0.0–0.2)
Basos: 1 %
EOS (ABSOLUTE): 0.3 10*3/uL (ref 0.0–0.4)
Eos: 3 %
Hematocrit: 41.4 % (ref 34.0–46.6)
Hemoglobin: 13.8 g/dL (ref 11.1–15.9)
Immature Grans (Abs): 0 10*3/uL (ref 0.0–0.1)
Immature Granulocytes: 0 %
Lymphocytes Absolute: 2.2 10*3/uL (ref 0.7–3.1)
Lymphs: 21 %
MCH: 30 pg (ref 26.6–33.0)
MCHC: 33.3 g/dL (ref 31.5–35.7)
MCV: 90 fL (ref 79–97)
Monocytes Absolute: 0.7 10*3/uL (ref 0.1–0.9)
Monocytes: 7 %
Neutrophils Absolute: 7.1 10*3/uL — ABNORMAL HIGH (ref 1.4–7.0)
Neutrophils: 68 %
Platelets: 443 10*3/uL (ref 150–450)
RBC: 4.6 x10E6/uL (ref 3.77–5.28)
RDW: 12 % (ref 11.7–15.4)
WBC: 10.5 10*3/uL (ref 3.4–10.8)

## 2020-12-30 LAB — TSH+FREE T4
Free T4: 1.13 ng/dL (ref 0.82–1.77)
TSH: 1.7 u[IU]/mL (ref 0.450–4.500)

## 2020-12-30 LAB — VITAMIN D 25 HYDROXY (VIT D DEFICIENCY, FRACTURES): Vit D, 25-Hydroxy: 38.3 ng/mL (ref 30.0–100.0)

## 2020-12-30 LAB — LIPID PANEL
Chol/HDL Ratio: 5.1 ratio — ABNORMAL HIGH (ref 0.0–4.4)
Cholesterol, Total: 205 mg/dL — ABNORMAL HIGH (ref 100–199)
HDL: 40 mg/dL (ref >39)
LDL Chol Calc (NIH): 140 mg/dL — ABNORMAL HIGH (ref 0–99)
Triglycerides: 137 mg/dL (ref 0–149)
VLDL Cholesterol Cal: 25 mg/dL (ref 5–40)

## 2020-12-30 LAB — PROLACTIN: Prolactin: 71.6 ng/mL — ABNORMAL HIGH (ref 4.8–23.3)

## 2020-12-30 MED ORDER — AIMOVIG 140 MG/ML ~~LOC~~ SOAJ: 140.0000 mg | SUBCUTANEOUS | 3 refills | Status: DC

## 2020-12-30 NOTE — Telephone Encounter (Signed)
Need PA

## 2020-12-30 NOTE — Progress Notes (Signed)
Prolactin level is high at 71.6. She has a pituitary adenoma and sees Dr. Lucilla Lame, endocrinology. Please call their office at [phone] to let them know her prolactin level. Barbara Stephens wanted the level checked since it had been a while when she came for her physical. Then please call the patient and let her know the level and that Dr. Joycie Peek office was notified.   Potassium was slightly high, will recheck in a month or so. Vitamin D and thyroid were normal. CBC grossly normal, lipid panel was abnormal with elevated total cholesterol at 204 and LDL at 140. May take OTC fish oil supplement of 1000mg  daily if not already doing so. Medication is not indicated for cholesterol levels at this time.

## 2021-01-02 ENCOUNTER — Telehealth: Payer: Self-pay

## 2021-01-02 NOTE — Telephone Encounter (Signed)
Spoke with Dr. Maudie Mercury office on Friday and informed them of results, they said they would call pt today. Spoke with pt and informed her of results.

## 2021-01-02 NOTE — Telephone Encounter (Signed)
-----   Message from Jonetta Osgood, NP sent at 12/30/2020  6:19 AM EDT ----- Prolactin level is high at 71.6. She has a pituitary adenoma and sees Dr. Lucilla Lame, endocrinology. Please call their office at [phone] to let them know her prolactin level. Khady wanted the level checked since it had been a while when she came for her physical. Then please call the patient and let her know the level and that Dr. Joycie Peek office was notified.   Potassium was slightly high, will recheck in a month or so. Vitamin D and thyroid were normal. CBC grossly normal, lipid panel was abnormal with elevated total cholesterol at 204 and LDL at 140. May take OTC fish oil supplement of 1000mg  daily if not already doing so. Medication is not indicated for cholesterol levels at this time.

## 2021-01-03 ENCOUNTER — Other Ambulatory Visit: Payer: Self-pay

## 2021-01-03 ENCOUNTER — Telehealth: Payer: Self-pay

## 2021-01-03 DIAGNOSIS — Z1211 Encounter for screening for malignant neoplasm of colon: Secondary | ICD-10-CM

## 2021-01-03 MED ORDER — NA SULFATE-K SULFATE-MG SULF 17.5-3.13-1.6 GM/177ML PO SOLN: 354.0000 mL | Freq: Once | ORAL | 0 refills | Status: AC

## 2021-01-03 MED ORDER — NITROFURANTOIN MONOHYD MACRO 100 MG PO CAPS: 100.0000 mg | ORAL_CAPSULE | Freq: Two times a day (BID) | ORAL | 0 refills | Status: DC

## 2021-01-03 NOTE — Telephone Encounter (Signed)
Gastroenterology Pre-Procedure Review  Request Date: 01/12/2021 Requesting Physician: Dr. Vicente Males   PATIENT REVIEW QUESTIONS: The patient responded to the following health history questions as indicated:    1. Are you having any GI issues? no 2. Do you have a personal history of Polyps? no 3. Do you have a family history of Colon Cancer or Polyps? no 4. Diabetes Mellitus? no 5. Joint replacements in the past 12 months?no 6. Major health problems in the past 3 months?no 7. Any artificial heart valves, MVP, or defibrillator?no    MEDICATIONS & ALLERGIES:    Patient reports the following regarding taking any anticoagulation/antiplatelet therapy:   Plavix, Coumadin, Eliquis, Xarelto, Lovenox, Pradaxa, Brilinta, or Effient? no Aspirin? no  Patient confirms/reports the following medications:  Current Outpatient Medications  Medication Sig Dispense Refill   ALPRAZolam (XANAX) 0.25 MG tablet Take 1 tablet (0.25 mg total) by mouth at bedtime as needed for anxiety. 30 tablet 2   busPIRone (BUSPAR) 10 MG tablet TAKE 1 TABLET BY MOUTH TWICE A DAY 180 tablet 2   Erenumab-aooe (AIMOVIG) 140 MG/ML SOAJ Inject 140 mg into the skin every 30 (thirty) days. 3 mL 3   metoprolol succinate (TOPROL-XL) 25 MG 24 hr tablet TAKE 1 TABLET (25 MG TOTAL) BY MOUTH DAILY. 30 tablet 3   norethindrone-ethinyl estradiol (LOESTRIN) 1-20 MG-MCG tablet Take 1 tablet by mouth daily after supper.      tirzepatide [facility]) 5 MG/0.5ML Pen Inject 5 mg into the skin once a week. 2 mL 2   Ubrogepant (UBRELVY) 100 MG TABS Take 100 mg by mouth daily as needed (acute migraine). 16 tablet 2   zolmitriptan (ZOMIG) 5 MG tablet Take 1 tablet (5 mg total) by mouth as needed for migraine. 30 tablet 1   No current facility-administered medications for this visit.    Patient confirms/reports the following allergies:  Allergies  Allergen Reactions   Yellow Jacket Venom [Bee Venom]     No orders of the defined types were placed in  this encounter.   AUTHORIZATION INFORMATION Primary Insurance: 1D#: Group #:  Secondary Insurance: 1D#: Group #:  SCHEDULE INFORMATION: Date:  Time: Location:

## 2021-01-03 NOTE — Telephone Encounter (Signed)
Pt called c/o having burning and urgency with urination.  Pt has been taking AZO but is not helping, pt asking for medication to be sent to pharmacy.  Spoke to [employer] and she sent in Macrobid 100 mg twice a day for 10 days to pt's pharnacy

## 2021-01-05 ENCOUNTER — Telehealth: Payer: Self-pay

## 2021-01-05 LAB — UA/M W/RFLX CULTURE, ROUTINE
Bilirubin, UA: NEGATIVE
Glucose, UA: NEGATIVE
Ketones, UA: NEGATIVE
Nitrite, UA: NEGATIVE
Protein,UA: NEGATIVE
RBC, UA: NEGATIVE
Specific Gravity, UA: 1.019 (ref 1.005–1.030)
Urobilinogen, Ur: 0.2 mg/dL (ref 0.2–1.0)
pH, UA: 6 (ref 5.0–7.5)

## 2021-01-05 LAB — URINE CULTURE, REFLEX

## 2021-01-05 LAB — MICROSCOPIC EXAMINATION
Casts: NONE SEEN /lpf
RBC, Urine: NONE SEEN /hpf (ref 0–2)

## 2021-01-05 NOTE — Telephone Encounter (Signed)
PA sent for AIMOVIG 140 MG/ML with Office notes to cover my meds @ 945pm

## 2021-01-06 ENCOUNTER — Telehealth: Payer: Self-pay

## 2021-01-06 NOTE — Telephone Encounter (Signed)
Spoke to pt about medication MOUNJARO  5 mg/0.75ml pen.  Pt is using a discount card to purchase the medication.

## 2021-01-12 ENCOUNTER — Ambulatory Visit: Payer: Managed Care, Other (non HMO) | Admitting: Certified Registered Nurse Anesthetist

## 2021-01-12 ENCOUNTER — Encounter: Payer: Self-pay | Admitting: Gastroenterology

## 2021-01-12 ENCOUNTER — Encounter
Admission: RE | Disposition: A | Discharge status: Home / Self Care | Payer: Self-pay | Source: Ambulatory Visit | Attending: Gastroenterology

## 2021-01-12 ENCOUNTER — Ambulatory Visit
Admission: RE | Admit: 2021-01-12 | Discharge: 2021-01-12 | Disposition: A | Discharge status: Home / Self Care | Payer: Managed Care, Other (non HMO) | Source: Ambulatory Visit | Attending: Gastroenterology | Admitting: Gastroenterology

## 2021-01-12 ENCOUNTER — Other Ambulatory Visit: Payer: Self-pay

## 2021-01-12 DIAGNOSIS — D128 Benign neoplasm of rectum: Secondary | ICD-10-CM | POA: Diagnosis not present

## 2021-01-12 DIAGNOSIS — K635 Polyp of colon: Secondary | ICD-10-CM | POA: Diagnosis not present

## 2021-01-12 DIAGNOSIS — Z1211 Encounter for screening for malignant neoplasm of colon: Secondary | ICD-10-CM | POA: Diagnosis present

## 2021-01-12 DIAGNOSIS — D126 Benign neoplasm of colon, unspecified: Secondary | ICD-10-CM

## 2021-01-12 DIAGNOSIS — Z79899 Other long term (current) drug therapy: Secondary | ICD-10-CM | POA: Insufficient documentation

## 2021-01-12 DIAGNOSIS — Z793 Long term (current) use of hormonal contraceptives: Secondary | ICD-10-CM | POA: Diagnosis not present

## 2021-01-12 DIAGNOSIS — Z91030 Bee allergy status: Secondary | ICD-10-CM | POA: Diagnosis not present

## 2021-01-12 DIAGNOSIS — Z87891 Personal history of nicotine dependence: Secondary | ICD-10-CM | POA: Insufficient documentation

## 2021-01-12 DIAGNOSIS — D123 Benign neoplasm of transverse colon: Secondary | ICD-10-CM | POA: Diagnosis not present

## 2021-01-12 HISTORY — PX: COLONOSCOPY WITH PROPOFOL: SHX5780

## 2021-01-12 LAB — POCT PREGNANCY, URINE: Preg Test, Ur: NEGATIVE

## 2021-01-12 SURGERY — COLONOSCOPY WITH PROPOFOL
Anesthesia: General

## 2021-01-12 MED ORDER — PROPOFOL 10 MG/ML IV BOLUS
INTRAVENOUS | Status: AC
  Filled 2021-01-12: qty 20

## 2021-01-12 MED ORDER — SODIUM CHLORIDE 0.9 % IV SOLN: INTRAVENOUS | Status: DC

## 2021-01-12 MED ORDER — LIDOCAINE HCL (CARDIAC) PF 100 MG/5ML IV SOSY
PREFILLED_SYRINGE | INTRAVENOUS | Status: DC | PRN
  Administered 2021-01-12: 50 mg via INTRAVENOUS

## 2021-01-12 MED ORDER — PROPOFOL 500 MG/50ML IV EMUL
INTRAVENOUS | Status: DC | PRN
  Administered 2021-01-12: 125 ug/kg/min via INTRAVENOUS

## 2021-01-12 MED ORDER — PROPOFOL 500 MG/50ML IV EMUL
INTRAVENOUS | Status: AC
  Filled 2021-01-12: qty 50

## 2021-01-12 MED ORDER — PROPOFOL 10 MG/ML IV BOLUS
INTRAVENOUS | Status: DC | PRN
  Administered 2021-01-12: 40 mg via INTRAVENOUS
  Administered 2021-01-12: 50 mg via INTRAVENOUS

## 2021-01-12 NOTE — Anesthesia Postprocedure Evaluation (Signed)
Anesthesia Post Note  Patient: Barbara Stephens  Procedure(s) Performed: COLONOSCOPY WITH PROPOFOL  Patient location during evaluation: Endoscopy Anesthesia Type: General Level of consciousness: awake and alert Pain management: pain level controlled Vital Signs Assessment: post-procedure vital signs reviewed and stable Respiratory status: spontaneous breathing, nonlabored ventilation and respiratory function stable Cardiovascular status: blood pressure returned to baseline and stable Postop Assessment: no apparent nausea or vomiting Anesthetic complications: no   No notable events documented.   Last Vitals:  Vitals:   01/12/21 1022 01/12/21 1032  BP: 100/68 125/78  Pulse: 83   Resp: 15   Temp: (!) 35.9 C   SpO2: 100%     Last Pain:  Vitals:   01/12/21 1042  TempSrc:   PainSc: 0-No pain                 [place] Ouch

## 2021-01-12 NOTE — Op Note (Signed)
[facility] Gastroenterology Patient Name: Barbara Stephens Procedure Date: 01/12/2021 9:48 AM MRN: [id number] Account #: [id number] Date of Birth: [birth year] Admit Type: Outpatient Age: 45 Room: [facility] ENDO ROOM 3 Gender: Female Note Status: Finalized Instrument Name: Park Meo [id number] Procedure:             Colonoscopy Indications:           Screening for colorectal malignant neoplasm Providers:             Jonathon Bellows MD, MD Referring MD:          Lavera Guise, MD (Referring MD) Medicines:             Monitored Anesthesia Care Complications:         No immediate complications. Procedure:             Pre-Anesthesia Assessment:                        - Prior to the procedure, a History and Physical was                         performed, and patient medications, allergies and                         sensitivities were reviewed. The patient's tolerance                         of previous anesthesia was reviewed.                        - The risks and benefits of the procedure and the                         sedation options and risks were discussed with the                         patient. All questions were answered and informed                         consent was obtained.                        - ASA Grade Assessment: II - A patient with mild                         systemic disease.                        After obtaining informed consent, the colonoscope was                         passed under direct vision. Throughout the procedure,                         the patient's blood pressure, pulse, and oxygen                         saturations were monitored continuously. The                         Colonoscope was  introduced through the anus and                         advanced to the the cecum, identified by the                         appendiceal orifice. The colonoscopy was performed                         with ease. The patient tolerated the procedure well.                          The quality of the bowel preparation was excellent. Findings:      The perianal and digital rectal examinations were normal.      Two sessile polyps were found in the sigmoid colon and transverse colon.       The polyps were 4 to 5 mm in size. These polyps were removed with a cold       snare. Resection and retrieval were complete.      Two sessile polyps were found in the rectum. The polyps were 3 to 4 mm       in size. These polyps were removed with a cold biopsy forceps. Resection       and retrieval were complete.      The exam was otherwise without abnormality on direct and retroflexion       views.      Multiple small-mouthed diverticula were found in the sigmoid colon.      The exam was otherwise without abnormality on direct and retroflexion       views. Impression:            - Two 4 to 5 mm polyps in the sigmoid colon and in the                         transverse colon, removed with a cold snare. Resected                         and retrieved.                        - Two 3 to 4 mm polyps in the rectum, removed with a                         cold biopsy forceps. Resected and retrieved.                        - The examination was otherwise normal on direct and                         retroflexion views. Recommendation:        - Discharge patient to home (with escort).                        - Resume previous diet.                        - Continue present medications.                        -  Await pathology results.                        - Repeat colonoscopy for surveillance based on                         pathology results. Procedure Code(s):     --- Professional ---                        [phone], Colonoscopy, flexible; with removal of                         tumor(s), polyp(s), or other lesion(s) by snare                         technique                        [id number], 97, Colonoscopy, flexible; with biopsy, single                         or  multiple Diagnosis Code(s):     --- Professional ---                        Z12.11, Encounter for screening for malignant neoplasm                         of colon                        K63.5, Polyp of colon                        K62.1, Rectal polyp CPT copyright 2019 [employer] Association. All rights reserved. The codes documented in this report are preliminary and upon coder review may  be revised to meet current compliance requirements. Jonathon Bellows, MD Jonathon Bellows MD, MD 01/12/2021 10:23:11 AM This report has been signed electronically. Number of Addenda: 0 Note Initiated On: 01/12/2021 9:48 AM Scope Withdrawal Time: 0 hours 13 minutes 13 seconds  Total Procedure Duration: 0 hours 14 minutes 53 seconds  Estimated Blood Loss:  Estimated blood loss: none.      [facility]

## 2021-01-12 NOTE — Anesthesia Preprocedure Evaluation (Addendum)
Anesthesia Evaluation  Patient identified by MRN, date of birth, ID band Patient awake    Reviewed: Allergy & Precautions, NPO status , Patient's Chart, lab work & pertinent test results  Airway Mallampati: III       Dental no notable dental hx.    Pulmonary neg pulmonary ROS, former smoker,    Pulmonary exam normal        Cardiovascular Exercise Tolerance: Good hypertension, Pt. on home beta blockers Normal cardiovascular exam     Neuro/Psych  Headaches, PSYCHIATRIC DISORDERS Anxiety    GI/Hepatic negative GI ROS, Neg liver ROS,   Endo/Other  negative endocrine ROS  Renal/GU negative Renal ROS  negative genitourinary   Musculoskeletal negative musculoskeletal ROS (+)   Abdominal (+) + obese,   Peds negative pediatric ROS (+)  Hematology negative hematology ROS (+)   Anesthesia Other Findings   Reproductive/Obstetrics negative OB ROS                            Anesthesia Physical Anesthesia Plan  ASA: 2  Anesthesia Plan: General   Post-op Pain Management:    Induction: Intravenous  PONV Risk Score and Plan: 3 and Treatment may vary due to age or medical condition and TIVA  Airway Management Planned: Natural Airway and Nasal Cannula  Additional Equipment:   Intra-op Plan:   Post-operative Plan:   Informed Consent: I have reviewed the patients History and Physical, chart, labs and discussed the procedure including the risks, benefits and alternatives for the proposed anesthesia with the patient or authorized representative who has indicated his/her understanding and acceptance.     Dental advisory given  Plan Discussed with: Anesthesiologist, CRNA and Surgeon  Anesthesia Plan Comments:        Anesthesia Quick Evaluation

## 2021-01-12 NOTE — Transfer of Care (Signed)
Immediate Anesthesia Transfer of Care Note  Patient: Barbara Stephens  Procedure(s) Performed: COLONOSCOPY WITH PROPOFOL  Patient Location: PACU  Anesthesia Type:General  Level of Consciousness: awake, alert  and oriented  Airway & Oxygen Therapy: Patient Spontanous Breathing and Patient connected to nasal cannula oxygen  Post-op Assessment: Report given to RN and Post -op Vital signs reviewed and stable  Post vital signs: Reviewed and stable  Last Vitals:  Vitals Value Taken Time  BP    Temp    Pulse 80 01/12/21 1023  Resp 15 01/12/21 1023  SpO2 100 % 01/12/21 1023  Vitals shown include unvalidated device data.  Last Pain:  Vitals:   01/12/21 0934  TempSrc: Temporal  PainSc: 0-No pain         Complications: No notable events documented.

## 2021-01-12 NOTE — H&P (Signed)
Jonathon Bellows, MD [address] [place], [place], [place], [address] [address], [place], [place], [place], [address] Phone: [phone]  Fax: [phone]  Primary Care Physician:  Lavera Guise, MD   Pre-Procedure History & Physical: HPI:  VANGIE HENTHORN is a 45 y.o. female is here for an colonoscopy.   Past Medical History:  Diagnosis Date   Hypertension    Migraines    Premenopausal patient     Past Surgical History:  Procedure Laterality Date   CESAREAN SECTION      Prior to Admission medications   Medication Sig Start Date End Date Taking? Authorizing Provider  ALPRAZolam (XANAX) 0.25 MG tablet Take 1 tablet (0.25 mg total) by mouth at bedtime as needed for anxiety. 11/24/20  Yes Abernathy, Alyssa, NP  busPIRone (BUSPAR) 10 MG tablet TAKE 1 TABLET BY MOUTH TWICE A DAY 12/16/20  Yes Abernathy, Alyssa, NP  Erenumab-aooe (AIMOVIG) 140 MG/ML SOAJ Inject 140 mg into the skin every 30 (thirty) days. 12/30/20  Yes Abernathy, Yetta Flock, NP  metoprolol succinate (TOPROL-XL) 25 MG 24 hr tablet TAKE 1 TABLET (25 MG TOTAL) BY MOUTH DAILY. 12/16/20  Yes Abernathy, Alyssa, NP  norethindrone-ethinyl estradiol (LOESTRIN) 1-20 MG-MCG tablet Take 1 tablet by mouth daily after supper.  12/21/19 01/12/21 Yes [provider]  tirzepatide [facility]) 5 MG/0.5ML Pen Inject 5 mg into the skin once a week. 12/29/20  Yes Abernathy, Alyssa, NP  Ubrogepant (UBRELVY) 100 MG TABS Take 100 mg by mouth daily as needed (acute migraine). 11/24/20  Yes Abernathy, Yetta Flock, NP  nitrofurantoin, macrocrystal-monohydrate, (MACROBID) 100 MG capsule Take 1 capsule (100 mg total) by mouth 2 (two) times daily. 01/03/21   Jonetta Osgood, NP  zolmitriptan (ZOMIG) 5 MG tablet Take 1 tablet (5 mg total) by mouth as needed for migraine. Patient not taking: Reported on 01/12/2021 02/08/20   Ronnell Freshwater, NP    Allergies as of 01/03/2021 - Review Complete 12/30/2020  Allergen Reaction Noted   Yellow jacket  venom [bee venom]  12/28/2019    Family History  Problem Relation Age of Onset   Thyroid disease Mother    Heart disease Mother    Lung cancer Father    Diabetes Father     Social History   Socioeconomic History   Marital status: Divorced    Spouse name: Not on file   Number of children: Not on file   Years of education: Not on file   Highest education level: Not on file  Occupational History   Not on file  Tobacco Use   Smoking status: Former    Types: Cigarettes   Smokeless tobacco: Never  Vaping Use   Vaping Use: Former  Substance and Sexual Activity   Alcohol use: Yes    Comment: occasionally   Drug use: Never   Sexual activity: Not on file  Other Topics Concern   Not on file  Social History Narrative   Not on file   Social Determinants of Health   Financial Resource Strain: Not on file  Food Insecurity: Not on file  Transportation Needs: Not on file  Physical Activity: Not on file  Stress: Not on file  Social Connections: Not on file  Intimate Partner Violence: Not on file    Review of Systems: See HPI, otherwise negative ROS  Physical Exam: BP 124/70   Pulse 85   Temp (!) 97.4 F (36.3 C) (Temporal)   Resp 16   Ht 5\' 5"  (1.651 m)  Wt 99.9 kg   SpO2 100%   BMI 36.65 kg/m  General:   Alert,  pleasant and cooperative in NAD Head:  Normocephalic and atraumatic. Neck:  Supple; no masses or thyromegaly. Lungs:  Clear throughout to auscultation, normal respiratory effort.    Heart:  +S1, +S2, Regular rate and rhythm, No edema. Abdomen:  Soft, nontender and nondistended. Normal bowel sounds, without guarding, and without rebound.   Neurologic:  Alert and  oriented x4;  grossly normal neurologically.  Impression/Plan: MAIRANY BRUNO is here for an colonoscopy to be performed for Screening colonoscopy average risk   Risks, benefits, limitations, and alternatives regarding  colonoscopy have been reviewed with the patient.  Questions have been  answered.  All parties agreeable.   Jonathon Bellows, MD  01/12/2021, 9:45 AM

## 2021-01-13 ENCOUNTER — Encounter: Payer: Self-pay | Admitting: Gastroenterology

## 2021-01-13 LAB — SURGICAL PATHOLOGY

## 2021-01-14 ENCOUNTER — Other Ambulatory Visit: Payer: Self-pay | Admitting: Nurse Practitioner

## 2021-01-14 DIAGNOSIS — I10 Essential (primary) hypertension: Secondary | ICD-10-CM

## 2021-01-17 ENCOUNTER — Other Ambulatory Visit: Payer: Self-pay | Admitting: Nurse Practitioner

## 2021-01-17 ENCOUNTER — Telehealth: Payer: Self-pay

## 2021-01-17 DIAGNOSIS — Z1231 Encounter for screening mammogram for malignant neoplasm of breast: Secondary | ICD-10-CM

## 2021-01-17 NOTE — Telephone Encounter (Signed)
Patient called pharmacy no longer see Mounjaro in their system to refill and patient states she no longer sees the med on mychart. She is asking for a call back-Toni

## 2021-01-18 ENCOUNTER — Telehealth: Payer: Self-pay

## 2021-01-18 NOTE — Telephone Encounter (Signed)
LMOM for pt and I was returning a call back to pt regarding prescription Mounjaro.  Told pt to call back if still having questions

## 2021-01-20 ENCOUNTER — Telehealth: Payer: Self-pay

## 2021-01-20 DIAGNOSIS — G43111 Migraine with aura, intractable, with status migrainosus: Secondary | ICD-10-CM

## 2021-01-20 DIAGNOSIS — E6609 Other obesity due to excess calories: Secondary | ICD-10-CM

## 2021-01-20 DIAGNOSIS — R7301 Impaired fasting glucose: Secondary | ICD-10-CM

## 2021-01-20 MED ORDER — AIMOVIG 140 MG/ML ~~LOC~~ SOAJ: 140.0000 mg | SUBCUTANEOUS | 3 refills | Status: DC

## 2021-01-20 MED ORDER — MOUNJARO 5 MG/0.5ML ~~LOC~~ SOAJ: 5.0000 mg | SUBCUTANEOUS | 2 refills | Status: DC

## 2021-01-20 NOTE — Telephone Encounter (Signed)
Pt asked to have [facility] sent to [facility] [address], sent to pharmacy

## 2021-01-20 NOTE — Telephone Encounter (Signed)
PA for AIMOVIG 140 mg/ml approved 01/20/21 and valid from 11/01/20 to 01/20/2022. Case # [id number].  Pt informed and new rx sent to pharmacy

## 2021-01-23 ENCOUNTER — Ambulatory Visit
Admission: RE | Admit: 2021-01-23 | Discharge: 2021-01-23 | Disposition: A | Discharge status: Home / Self Care | Payer: Managed Care, Other (non HMO) | Source: Ambulatory Visit | Attending: Nurse Practitioner | Admitting: Nurse Practitioner

## 2021-01-23 ENCOUNTER — Other Ambulatory Visit: Payer: Self-pay

## 2021-01-23 ENCOUNTER — Encounter: Payer: Self-pay | Admitting: Gastroenterology

## 2021-01-23 DIAGNOSIS — Z1231 Encounter for screening mammogram for malignant neoplasm of breast: Secondary | ICD-10-CM | POA: Insufficient documentation

## 2021-01-26 ENCOUNTER — Other Ambulatory Visit: Payer: Self-pay

## 2021-01-26 ENCOUNTER — Encounter: Payer: Self-pay | Admitting: Nurse Practitioner

## 2021-01-26 ENCOUNTER — Ambulatory Visit: Payer: Managed Care, Other (non HMO) | Admitting: Nurse Practitioner

## 2021-01-26 VITALS — BP 130/80 | HR 88 | Temp 98.5°F | Resp 16 | Ht 65.0 in | Wt 212.8 lb

## 2021-01-26 DIAGNOSIS — R7301 Impaired fasting glucose: Secondary | ICD-10-CM

## 2021-01-26 DIAGNOSIS — G43119 Migraine with aura, intractable, without status migrainosus: Secondary | ICD-10-CM | POA: Diagnosis not present

## 2021-01-26 DIAGNOSIS — D352 Benign neoplasm of pituitary gland: Secondary | ICD-10-CM | POA: Diagnosis not present

## 2021-01-26 DIAGNOSIS — I10 Essential (primary) hypertension: Secondary | ICD-10-CM

## 2021-01-26 DIAGNOSIS — E875 Hyperkalemia: Secondary | ICD-10-CM | POA: Diagnosis not present

## 2021-01-26 DIAGNOSIS — Z6836 Body mass index (BMI) 36.0-36.9, adult: Secondary | ICD-10-CM

## 2021-01-26 DIAGNOSIS — E6609 Other obesity due to excess calories: Secondary | ICD-10-CM

## 2021-01-26 MED ORDER — MOUNJARO 5 MG/0.5ML ~~LOC~~ SOAJ: 5.0000 mg | SUBCUTANEOUS | 2 refills | Status: DC

## 2021-01-26 NOTE — Progress Notes (Signed)
[facility] [place], [place] [address]  Internal MEDICINE  Office Visit Note  Patient Name: Barbara Stephens  [id number]  [id number]  Date of Service: 01/26/2021  Chief Complaint  Patient presents with   Follow-up    Discuss meds   Weight Loss   Hypertension    HPI Barbara Stephens presents for a follow up visit for weight loss management and hypertension. Her blood pressure has been well controlled since her previous office visit in September. In August, the amlodipine was discontinued and she was started on metoprolol succinate 25 mg daily. This has helped her blood pressure and heart rate which was on the high normal end.  Aimovig was finally approved by her insurance as of 01/20/21. She is requesting co-pay card/manufacturer savings information today.  She was provided with a 4 week sample of mounjaro at her previous office visit for weight loss and she has lost 8 lbs since her previous office visit.  Reviewed labs with her today that were ordered at her previous office visit, her potassium level was slightly elevated. Her prolactin level is elevated and she was previously made aware and Dr. Joycie Peek office was also notified. She has an upcoming appt with them. She has a previously diagnosed prolactinoma that Dr. Gabriel Carina is monitoring.  Lipid panel is abnormal but statin therapy is not indicated at this time, encouraged fish oil supplement 1000 mg daily. Vitamin D and thyroid levels were normal, CBC grossly normal.       Current Medication: Outpatient Encounter Medications as of 01/26/2021  Medication Sig   ALPRAZolam (XANAX) 0.25 MG tablet Take 1 tablet (0.25 mg total) by mouth at bedtime as needed for anxiety.   busPIRone (BUSPAR) 10 MG tablet TAKE 1 TABLET BY MOUTH TWICE A DAY   Erenumab-aooe (AIMOVIG) 140 MG/ML SOAJ Inject 140 mg into the skin every 30 (thirty) days.   metoprolol succinate (TOPROL-XL) 25 MG 24 hr tablet TAKE 1 TABLET (25 MG TOTAL) BY MOUTH  DAILY.   nitrofurantoin, macrocrystal-monohydrate, (MACROBID) 100 MG capsule Take 1 capsule (100 mg total) by mouth 2 (two) times daily.   Ubrogepant (UBRELVY) 100 MG TABS Take 100 mg by mouth daily as needed (acute migraine).   [DISCONTINUED] tirzepatide [facility]) 5 MG/0.5ML Pen Inject 5 mg into the skin once a week.   norethindrone-ethinyl estradiol (LOESTRIN) 1-20 MG-MCG tablet Take 1 tablet by mouth daily after supper.    tirzepatide [facility]) 5 MG/0.5ML Pen Inject 5 mg into the skin once a week.   [DISCONTINUED] zolmitriptan (ZOMIG) 5 MG tablet Take 1 tablet (5 mg total) by mouth as needed for migraine. (Patient not taking: Reported on 01/12/2021)   No facility-administered encounter medications on file as of 01/26/2021.    Surgical History: Past Surgical History:  Procedure Laterality Date   CESAREAN SECTION     COLONOSCOPY WITH PROPOFOL N/A 01/12/2021   Procedure: COLONOSCOPY WITH PROPOFOL;  Surgeon: Jonathon Bellows, MD;  Location: [facility] ENDOSCOPY;  Service: Gastroenterology;  Laterality: N/A;    Medical History: Past Medical History:  Diagnosis Date   Hypertension    Migraines    Premenopausal patient     Family History: Family History  Problem Relation Age of Onset   Thyroid disease Mother    Heart disease Mother    Lung cancer Father    Diabetes Father    Breast cancer Neg Hx     Social History   Socioeconomic History   Marital status: Divorced    Spouse name: Not  on file   Number of children: Not on file   Years of education: Not on file   Highest education level: Not on file  Occupational History   Not on file  Tobacco Use   Smoking status: Former    Types: Cigarettes   Smokeless tobacco: Never  Vaping Use   Vaping Use: Former  Substance and Sexual Activity   Alcohol use: Yes    Comment: occasionally   Drug use: Never   Sexual activity: Not on file  Other Topics Concern   Not on file  Social History Narrative   Not on file   Social Determinants of  Health   Financial Resource Strain: Not on file  Food Insecurity: Not on file  Transportation Needs: Not on file  Physical Activity: Not on file  Stress: Not on file  Social Connections: Not on file  Intimate Partner Violence: Not on file      Review of Systems  Constitutional:  Negative for chills, fatigue and unexpected weight change.  HENT:  Negative for congestion, rhinorrhea, sneezing and sore throat.   Eyes:  Negative for redness.  Respiratory:  Negative for cough, chest tightness and shortness of breath.   Cardiovascular:  Negative for chest pain and palpitations.  Gastrointestinal:  Negative for abdominal pain, constipation, diarrhea, nausea and vomiting.  Genitourinary:  Negative for dysuria and frequency.  Musculoskeletal:  Negative for arthralgias, back pain, joint swelling and neck pain.  Skin:  Negative for rash.  Neurological: Negative.  Negative for tremors and numbness.  Hematological:  Negative for adenopathy. Does not bruise/bleed easily.  Psychiatric/Behavioral:  Negative for behavioral problems (Depression), sleep disturbance and suicidal ideas. The patient is not nervous/anxious.    Vital Signs: BP 130/80 Comment: 139/94  Pulse 88   Temp 98.5 F (36.9 C)   Resp 16   Ht 5\' 5"  (1.651 m)   Wt 212 lb 12.8 oz (96.5 kg)   LMP 01/02/2021   SpO2 98%   BMI 35.41 kg/m    Physical Exam Vitals reviewed.  Constitutional:      General: She is not in acute distress.    Appearance: Normal appearance. She is obese. She is not ill-appearing.  HENT:     Head: Normocephalic and atraumatic.  Eyes:     Extraocular Movements: Extraocular movements intact.     Pupils: Pupils are equal, round, and reactive to light.  Cardiovascular:     Rate and Rhythm: Normal rate and regular rhythm.  Pulmonary:     Effort: Pulmonary effort is normal. No respiratory distress.  Neurological:     Mental Status: She is alert and oriented to person, place, and time.     Cranial  Nerves: No cranial nerve deficit.     Coordination: Coordination normal.     Gait: Gait normal.  Psychiatric:        Mood and Affect: Mood normal.        Behavior: Behavior normal.       Assessment/Plan: 1. Essential hypertension Stable with current medication.   2. Intractable migraine with aura without status migrainosus Aimovig has been approved by her insurance, copay savings information provided to patient, patient takes [place] for acute migraines.   3. Hyperkalemia Potassium level of 5.3, will repeat lab in a few weeks  4. Pituitary adenoma ([place]) Prolactin level is elevated, Dr. Joycie Peek office was notified, patient is scheduled to see Dr. Gabriel Carina who is monitoring her pituitary adenoma.   5. Impaired fasting glucose Lost 8 lbs,  mounjaro prescribed to aide in weight loss.  - tirzepatide [facility]) 5 MG/0.5ML Pen; Inject 5 mg into the skin once a week.  Dispense: 2 mL; Refill: 2  6. Class 2 obesity due to excess calories without serious comorbidity with body mass index (BMI) of 36.0 to 36.9 in adult Lost 8 lbs, continue mounjaro at increased dose of 5 mg weekly. Continue current diet and lifestyle modifications. No record of metabolic test, consider doing metabolic test at next office visit or if patient plateaus on mounjaro or stops taking the medication.  - tirzepatide [facility]) 5 MG/0.5ML Pen; Inject 5 mg into the skin once a week.  Dispense: 2 mL; Refill: 2   General Counseling: Barbara Stephens verbalizes understanding of the findings of todays visit and agrees with plan of treatment. I have discussed any further diagnostic evaluation that may be needed or ordered today. We also reviewed her medications today. she has been encouraged to call the office with any questions or concerns that should arise related to todays visit.    No orders of the defined types were placed in this encounter.   Meds ordered this encounter  Medications   tirzepatide (MOUNJARO) 5 MG/0.5ML Pen     Sig: Inject 5 mg into the skin once a week.    Dispense:  2 mL    Refill:  2    Return in about 2 months (around 03/28/2021) for F/U, Weight loss, Barbara Stephens PCP.   Total time spent:30 Minutes Time spent includes review of chart, medications, test results, and follow up plan with the patient.    Controlled Substance Database was reviewed by me.  This patient was seen by Jonetta Osgood, FNP-C in collaboration with Dr. Clayborn Bigness as a part of collaborative care agreement.   Barbara Stephens R. Valetta Fuller, MSN, FNP-C Internal medicine

## 2021-03-27 ENCOUNTER — Other Ambulatory Visit: Payer: Self-pay

## 2021-03-27 ENCOUNTER — Ambulatory Visit: Payer: Managed Care, Other (non HMO) | Admitting: Nurse Practitioner

## 2021-03-27 ENCOUNTER — Encounter: Payer: Self-pay | Admitting: Nurse Practitioner

## 2021-03-27 VITALS — BP 126/85 | HR 82 | Temp 98.6°F | Resp 16 | Ht 65.0 in | Wt 201.0 lb

## 2021-03-27 DIAGNOSIS — R7301 Impaired fasting glucose: Secondary | ICD-10-CM

## 2021-03-27 DIAGNOSIS — E6609 Other obesity due to excess calories: Secondary | ICD-10-CM

## 2021-03-27 DIAGNOSIS — G43119 Migraine with aura, intractable, without status migrainosus: Secondary | ICD-10-CM | POA: Diagnosis not present

## 2021-03-27 DIAGNOSIS — Z6836 Body mass index (BMI) 36.0-36.9, adult: Secondary | ICD-10-CM

## 2021-03-27 DIAGNOSIS — I10 Essential (primary) hypertension: Secondary | ICD-10-CM

## 2021-03-27 MED ORDER — TIRZEPATIDE 7.5 MG/0.5ML ~~LOC~~ SOAJ: 7.5000 mg | SUBCUTANEOUS | 3 refills | Status: DC

## 2021-03-27 NOTE — Progress Notes (Signed)
[facility] [place], [place] [address]  Internal MEDICINE  Office Visit Note  Patient Name: Barbara Stephens  [id number]  [id number]  Date of Service: 03/27/2021  Chief Complaint  Patient presents with   Follow-up   Hypertension   Weight Loss    HPI Barbara Stephens presents for a follow up visit for hypertension and weight loss management. Weight at home was 199.2 lbs today. He has lost 11 lbs since her previous office visit. Her migraines have improved and she has not had them as often. Last week was stressful but the medications for migraines did help.  She reports that she has not needed the buspirone anymore. Her blood pressure is also stable with current medications.    Current Medication: Outpatient Encounter Medications as of 03/27/2021  Medication Sig   ALPRAZolam (XANAX) 0.25 MG tablet Take 1 tablet (0.25 mg total) by mouth at bedtime as needed for anxiety.   busPIRone (BUSPAR) 10 MG tablet TAKE 1 TABLET BY MOUTH TWICE A DAY   Erenumab-aooe (AIMOVIG) 140 MG/ML SOAJ Inject 140 mg into the skin every 30 (thirty) days.   metoprolol succinate (TOPROL-XL) 25 MG 24 hr tablet TAKE 1 TABLET (25 MG TOTAL) BY MOUTH DAILY.   tirzepatide (MOUNJARO) 7.5 MG/0.5ML Pen Inject 7.5 mg into the skin once a week.   Ubrogepant (UBRELVY) 100 MG TABS Take 100 mg by mouth daily as needed (acute migraine).   [DISCONTINUED] nitrofurantoin, macrocrystal-monohydrate, (MACROBID) 100 MG capsule Take 1 capsule (100 mg total) by mouth 2 (two) times daily.   [DISCONTINUED] tirzepatide [facility]) 5 MG/0.5ML Pen Inject 5 mg into the skin once a week.   norethindrone-ethinyl estradiol (LOESTRIN) 1-20 MG-MCG tablet Take 1 tablet by mouth daily after supper.    No facility-administered encounter medications on file as of 03/27/2021.    Surgical History: Past Surgical History:  Procedure Laterality Date   CESAREAN SECTION     COLONOSCOPY WITH PROPOFOL N/A 01/12/2021   Procedure:  COLONOSCOPY WITH PROPOFOL;  Surgeon: Jonathon Bellows, MD;  Location: [facility] ENDOSCOPY;  Service: Gastroenterology;  Laterality: N/A;    Medical History: Past Medical History:  Diagnosis Date   Hypertension    Migraines    Premenopausal patient     Family History: Family History  Problem Relation Age of Onset   Thyroid disease Mother    Heart disease Mother    Lung cancer Father    Diabetes Father    Breast cancer Neg Hx     Social History   Socioeconomic History   Marital status: Divorced    Spouse name: Not on file   Number of children: Not on file   Years of education: Not on file   Highest education level: Not on file  Occupational History   Not on file  Tobacco Use   Smoking status: Former    Types: Cigarettes   Smokeless tobacco: Never  Vaping Use   Vaping Use: Former  Substance and Sexual Activity   Alcohol use: Yes    Comment: occasionally   Drug use: Never   Sexual activity: Not on file  Other Topics Concern   Not on file  Social History Narrative   Not on file   Social Determinants of Health   Financial Resource Strain: Not on file  Food Insecurity: Not on file  Transportation Needs: Not on file  Physical Activity: Not on file  Stress: Not on file  Social Connections: Not on file  Intimate Partner Violence: Not on file  Review of Systems  Constitutional:  Negative for chills, fatigue and unexpected weight change.  HENT:  Negative for congestion, rhinorrhea, sneezing and sore throat.   Eyes:  Negative for redness.  Respiratory:  Negative for cough, chest tightness and shortness of breath.   Cardiovascular:  Negative for chest pain and palpitations.  Gastrointestinal:  Negative for abdominal pain, constipation, diarrhea, nausea and vomiting.  Genitourinary:  Negative for dysuria and frequency.  Musculoskeletal:  Negative for arthralgias, back pain, joint swelling and neck pain.  Skin:  Negative for rash.  Neurological: Negative.  Negative for  tremors and numbness.  Hematological:  Negative for adenopathy. Does not bruise/bleed easily.  Psychiatric/Behavioral:  Negative for behavioral problems (Depression), sleep disturbance and suicidal ideas. The patient is not nervous/anxious.    Vital Signs: BP 126/85    Pulse 82    Temp 98.6 F (37 C)    Resp 16    Ht 5\' 5"  (1.651 m)    Wt 201 lb (91.2 kg)    SpO2 98%    BMI 33.45 kg/m    Physical Exam Vitals reviewed.  Constitutional:      General: She is not in acute distress.    Appearance: Normal appearance. She is obese. She is not ill-appearing.  HENT:     Head: Normocephalic and atraumatic.  Eyes:     Pupils: Pupils are equal, round, and reactive to light.  Pulmonary:     Effort: Pulmonary effort is normal. No respiratory distress.  Neurological:     Mental Status: She is alert and oriented to person, place, and time.     Cranial Nerves: No cranial nerve deficit.     Coordination: Coordination normal.     Gait: Gait normal.  Psychiatric:        Mood and Affect: Mood normal.        Behavior: Behavior normal.       Assessment/Plan: 1. Essential hypertension Blood pressure is well controlled with current medication.   2. Intractable migraine with aura without status migrainosus She takes aimovig injections monthly. She does notice more migraines when it is almost time to give her next injection but otherwise is doing much better  3. Impaired fasting glucose Continue mounjaro, dose increased.   4. Class 2 obesity due to excess calories without serious comorbidity with body mass index (BMI) of 36.0 to 36.9 in adult Continuing to lose weight on mounjaro. Increased dose from 5 mg weekly to 7.5 mg weekly. Follow up in 2 months for weight loss.  - tirzepatide (MOUNJARO) 7.5 MG/0.5ML Pen; Inject 7.5 mg into the skin once a week.  Dispense: 2 mL; Refill: 3   General Counseling: Barbara Stephens verbalizes understanding of the findings of todays visit and agrees with plan of  treatment. I have discussed any further diagnostic evaluation that may be needed or ordered today. We also reviewed her medications today. she has been encouraged to call the office with any questions or concerns that should arise related to todays visit.    No orders of the defined types were placed in this encounter.   Meds ordered this encounter  Medications   tirzepatide (MOUNJARO) 7.5 MG/0.5ML Pen    Sig: Inject 7.5 mg into the skin once a week.    Dispense:  2 mL    Refill:  3    Return in about 2 months (around 05/28/2021) for F/U, Weight loss, Grier Czerwinski PCP.   Total time spent:30 Minutes Time spent includes review of chart, medications, test  results, and follow up plan with the patient.   [place] Controlled Substance Database was reviewed by me.  This patient was seen by Jonetta Osgood, FNP-C in collaboration with Dr. Clayborn Bigness as a part of collaborative care agreement.   Emerald Shor R. Valetta Fuller, MSN, FNP-C Internal medicine

## 2021-04-05 ENCOUNTER — Telehealth: Payer: Self-pay

## 2021-04-05 NOTE — Telephone Encounter (Signed)
Pt using coupon for [employer] no PA needed

## 2021-04-07 ENCOUNTER — Other Ambulatory Visit: Payer: Self-pay | Admitting: Internal Medicine

## 2021-04-07 DIAGNOSIS — E221 Hyperprolactinemia: Secondary | ICD-10-CM

## 2021-04-28 ENCOUNTER — Other Ambulatory Visit: Payer: Self-pay | Admitting: Nurse Practitioner

## 2021-04-28 DIAGNOSIS — I10 Essential (primary) hypertension: Secondary | ICD-10-CM

## 2021-05-12 ENCOUNTER — Other Ambulatory Visit: Payer: Self-pay

## 2021-05-12 DIAGNOSIS — I10 Essential (primary) hypertension: Secondary | ICD-10-CM

## 2021-05-12 MED ORDER — METOPROLOL SUCCINATE ER 25 MG PO TB24: 25.0000 mg | ORAL_TABLET | Freq: Every day | ORAL | 1 refills | Status: DC

## 2021-05-12 NOTE — Telephone Encounter (Signed)
Spoke to pt about a PA I received for MOUNJARO 7.5 mg.  Asking pt if the PA needs to be done.  Pt advised she thinks it is covered on her insurance if done thru mail order but will let us know when she come to her appt on 05/29/21.  Pt is using a coupon at the moment

## 2021-05-29 ENCOUNTER — Encounter: Payer: Self-pay | Admitting: Nurse Practitioner

## 2021-05-29 ENCOUNTER — Ambulatory Visit: Payer: Managed Care, Other (non HMO) | Admitting: Nurse Practitioner

## 2021-05-29 ENCOUNTER — Other Ambulatory Visit: Payer: Self-pay

## 2021-05-29 VITALS — BP 110/60 | HR 80 | Temp 98.5°F | Resp 16 | Ht 65.0 in | Wt 195.6 lb

## 2021-05-29 DIAGNOSIS — I10 Essential (primary) hypertension: Secondary | ICD-10-CM | POA: Diagnosis not present

## 2021-05-29 DIAGNOSIS — G43119 Migraine with aura, intractable, without status migrainosus: Secondary | ICD-10-CM

## 2021-05-29 DIAGNOSIS — R7301 Impaired fasting glucose: Secondary | ICD-10-CM

## 2021-05-29 DIAGNOSIS — Z6836 Body mass index (BMI) 36.0-36.9, adult: Secondary | ICD-10-CM

## 2021-05-29 DIAGNOSIS — E6609 Other obesity due to excess calories: Secondary | ICD-10-CM

## 2021-05-29 DIAGNOSIS — F411 Generalized anxiety disorder: Secondary | ICD-10-CM | POA: Diagnosis not present

## 2021-05-29 MED ORDER — ESOMEPRAZOLE MAGNESIUM 20 MG PO CPDR: 20.0000 mg | DELAYED_RELEASE_CAPSULE | Freq: Every day | ORAL | Status: DC

## 2021-05-29 MED ORDER — TIRZEPATIDE 7.5 MG/0.5ML ~~LOC~~ SOAJ: 7.5000 mg | SUBCUTANEOUS | 3 refills | Status: DC

## 2021-05-29 MED ORDER — AIMOVIG 140 MG/ML ~~LOC~~ SOAJ: 140.0000 mg | SUBCUTANEOUS | 3 refills | Status: DC

## 2021-05-29 MED ORDER — BUSPIRONE HCL 10 MG PO TABS: 10.0000 mg | ORAL_TABLET | Freq: Two times a day (BID) | ORAL | 2 refills | Status: DC

## 2021-05-29 MED ORDER — UBRELVY 100 MG PO TABS: 100.0000 mg | ORAL_TABLET | Freq: Every day | ORAL | 2 refills | Status: DC | PRN

## 2021-05-29 NOTE — Progress Notes (Signed)
[facility] [place], [place] [address]  Internal MEDICINE  Office Visit Note  Patient Name: Barbara Stephens  [id number]  [id number]  Date of Service: 05/29/2021  Chief Complaint  Patient presents with   Follow-up   Weight Loss    HPI Mekaylah presents for a follow up visit for weight loss management, anxiety, GERD, and migraines. She is continuing to take mounjaro and was supposed to increase to the 7.5 mg dose but it was on backorder so she stayed on the 5 mg dose. She has lost 6 lbs since her previous office visit.  She is in need of refills of aimovig and ubrelvy. These medications continue to be effective for prevention of migraines and acute treatment of migraines.  She has been taking OTC esomeprazole for GERD symptoms and it has been helping.  Anxiety remains well controlled with buspirone.    Current Medication: Outpatient Encounter Medications as of 05/29/2021  Medication Sig   ALPRAZolam (XANAX) 0.25 MG tablet Take 1 tablet (0.25 mg total) by mouth at bedtime as needed for anxiety.   esomeprazole (NEXIUM) 20 MG capsule Take 1 capsule (20 mg total) by mouth daily at 12 noon.   metoprolol succinate (TOPROL-XL) 25 MG 24 hr tablet Take 1 tablet (25 mg total) by mouth daily.   [DISCONTINUED] busPIRone (BUSPAR) 10 MG tablet TAKE 1 TABLET BY MOUTH TWICE A DAY   [DISCONTINUED] Erenumab-aooe (AIMOVIG) 140 MG/ML SOAJ Inject 140 mg into the skin every 30 (thirty) days.   [DISCONTINUED] tirzepatide (MOUNJARO) 7.5 MG/0.5ML Pen Inject 7.5 mg into the skin once a week.   [DISCONTINUED] Ubrogepant (UBRELVY) 100 MG TABS Take 100 mg by mouth daily as needed (acute migraine).   busPIRone (BUSPAR) 10 MG tablet Take 1 tablet (10 mg total) by mouth 2 (two) times daily.   Erenumab-aooe (AIMOVIG) 140 MG/ML SOAJ Inject 140 mg into the skin every 30 (thirty) days.   norethindrone-ethinyl estradiol (LOESTRIN) 1-20 MG-MCG tablet Take 1 tablet by mouth daily after supper.     tirzepatide (MOUNJARO) 7.5 MG/0.5ML Pen Inject 7.5 mg into the skin once a week.   Ubrogepant (UBRELVY) 100 MG TABS Take 100 mg by mouth daily as needed (acute migraine).   No facility-administered encounter medications on file as of 05/29/2021.    Surgical History: Past Surgical History:  Procedure Laterality Date   CESAREAN SECTION     COLONOSCOPY WITH PROPOFOL N/A 01/12/2021   Procedure: COLONOSCOPY WITH PROPOFOL;  Surgeon: Jonathon Bellows, MD;  Location: [facility] ENDOSCOPY;  Service: Gastroenterology;  Laterality: N/A;    Medical History: Past Medical History:  Diagnosis Date   Hypertension    Migraines    Premenopausal patient     Family History: Family History  Problem Relation Age of Onset   Thyroid disease Mother    Heart disease Mother    Lung cancer Father    Diabetes Father    Breast cancer Neg Hx     Social History   Socioeconomic History   Marital status: Divorced    Spouse name: Not on file   Number of children: Not on file   Years of education: Not on file   Highest education level: Not on file  Occupational History   Not on file  Tobacco Use   Smoking status: Former    Types: Cigarettes   Smokeless tobacco: Never  Vaping Use   Vaping Use: Former  Substance and Sexual Activity   Alcohol use: Yes    Comment: occasionally  Drug use: Never   Sexual activity: Not on file  Other Topics Concern   Not on file  Social History Narrative   Not on file   Social Determinants of Health   Financial Resource Strain: Not on file  Food Insecurity: Not on file  Transportation Needs: Not on file  Physical Activity: Not on file  Stress: Not on file  Social Connections: Not on file  Intimate Partner Violence: Not on file      Review of Systems  Constitutional:  Negative for chills, fatigue and unexpected weight change.  HENT:  Negative for congestion, rhinorrhea, sneezing and sore throat.   Eyes:  Negative for redness.  Respiratory:  Negative for cough,  chest tightness and shortness of breath.   Cardiovascular:  Negative for chest pain and palpitations.  Gastrointestinal:  Negative for abdominal pain, constipation, diarrhea, nausea and vomiting.  Genitourinary:  Negative for dysuria and frequency.  Musculoskeletal:  Negative for arthralgias, back pain, joint swelling and neck pain.  Skin:  Negative for rash.  Neurological: Negative.  Negative for tremors and numbness.  Hematological:  Negative for adenopathy. Does not bruise/bleed easily.  Psychiatric/Behavioral:  Negative for behavioral problems (Depression), sleep disturbance and suicidal ideas. The patient is not nervous/anxious.    Vital Signs: Pulse 80    Temp 98.5 F (36.9 C)    Resp 16    Ht 5\' 5"  (1.651 m)    Wt 195 lb 9.6 oz (88.7 kg)    SpO2 97%    BMI 32.55 kg/m    Physical Exam Vitals reviewed.  Constitutional:      General: She is not in acute distress.    Appearance: Normal appearance. She is obese. She is not ill-appearing.  HENT:     Head: Normocephalic and atraumatic.  Eyes:     Pupils: Pupils are equal, round, and reactive to light.  Cardiovascular:     Rate and Rhythm: Normal rate and regular rhythm.  Pulmonary:     Effort: Pulmonary effort is normal. No respiratory distress.  Neurological:     Mental Status: She is alert and oriented to person, place, and time.     Cranial Nerves: No cranial nerve deficit.  Psychiatric:        Mood and Affect: Mood normal.        Behavior: Behavior normal.       Assessment/Plan: 1. Essential hypertension Well controlled. Takes metoprolol.   2. Intractable migraine with aura without status migrainosus Stable, controlled, takes aimovig for prevention and ubrelvy for acute migraines - Erenumab-aooe (AIMOVIG) 140 MG/ML SOAJ; Inject 140 mg into the skin every 30 (thirty) days.  Dispense: 3 mL; Refill: 3 - Ubrogepant (UBRELVY) 100 MG TABS; Take 100 mg by mouth daily as needed (acute migraine).  Dispense: 16 tablet;  Refill: 2  3. Impaired fasting glucose A1C was checked and it was   4. Generalized anxiety disorder Stable, continue buspirone as prescribed.  - busPIRone (BUSPAR) 10 MG tablet; Take 1 tablet (10 mg total) by mouth 2 (two) times daily.  Dispense: 180 tablet; Refill: 2  5. Class 2 obesity due to excess calories without serious comorbidity with body mass index (BMI) of 36.0 to 36.9 in adult Increase dose to 7.5 mg weekly if available.  - tirzepatide (MOUNJARO) 7.5 MG/0.5ML Pen; Inject 7.5 mg into the skin once a week.  Dispense: 2 mL; Refill: 3   General Counseling: Adelena verbalizes understanding of the findings of todays visit and agrees with plan of  treatment. I have discussed any further diagnostic evaluation that may be needed or ordered today. We also reviewed her medications today. she has been encouraged to call the office with any questions or concerns that should arise related to todays visit.    No orders of the defined types were placed in this encounter.   Meds ordered this encounter  Medications   tirzepatide (MOUNJARO) 7.5 MG/0.5ML Pen    Sig: Inject 7.5 mg into the skin once a week.    Dispense:  2 mL    Refill:  3   Erenumab-aooe (AIMOVIG) 140 MG/ML SOAJ    Sig: Inject 140 mg into the skin every 30 (thirty) days.    Dispense:  3 mL    Refill:  3    PA was approved 01/20/2021 valid from 11/01/20 to 01/20/2022   Ubrogepant (UBRELVY) 100 MG TABS    Sig: Take 100 mg by mouth daily as needed (acute migraine).    Dispense:  16 tablet    Refill:  2   busPIRone (BUSPAR) 10 MG tablet    Sig: Take 1 tablet (10 mg total) by mouth 2 (two) times daily.    Dispense:  180 tablet    Refill:  2   esomeprazole (NEXIUM) 20 MG capsule    Sig: Take 1 capsule (20 mg total) by mouth daily at 12 noon.    Return in about 1 month (around 06/26/2021) for F/U, Weight loss, Malikhi Ogan PCP.   Total time spent:30 Minutes Time spent includes review of chart, medications, test results, and  follow up plan with the patient.   [place] Controlled Substance Database was reviewed by me.  This patient was seen by Jonetta Osgood, FNP-C in collaboration with Dr. Clayborn Bigness as a part of collaborative care agreement.   Christino Mcglinchey R. Valetta Fuller, MSN, FNP-C Internal medicine

## 2021-05-30 ENCOUNTER — Encounter: Payer: Self-pay | Admitting: Nurse Practitioner

## 2021-06-02 ENCOUNTER — Telehealth: Payer: Self-pay

## 2021-06-02 NOTE — Telephone Encounter (Signed)
Sent plan for ubrelvy. PA pending.

## 2021-06-05 ENCOUNTER — Telehealth: Payer: Self-pay

## 2021-06-05 NOTE — Telephone Encounter (Signed)
LMOM for pt to return call to discuss her medication UBRELVY.

## 2021-06-11 ENCOUNTER — Other Ambulatory Visit: Payer: Self-pay

## 2021-06-11 DIAGNOSIS — G43119 Migraine with aura, intractable, without status migrainosus: Secondary | ICD-10-CM

## 2021-06-11 MED ORDER — UBRELVY 100 MG PO TABS: 100.0000 mg | ORAL_TABLET | Freq: Every day | ORAL | 2 refills | Status: DC | PRN

## 2021-06-25 ENCOUNTER — Telehealth: Payer: Self-pay

## 2021-06-25 NOTE — Telephone Encounter (Signed)
PA for [facility] sent 06/25/21 @ 350 pm ?

## 2021-06-26 ENCOUNTER — Other Ambulatory Visit: Payer: Self-pay

## 2021-06-26 ENCOUNTER — Encounter: Payer: Self-pay | Admitting: Nurse Practitioner

## 2021-06-26 ENCOUNTER — Ambulatory Visit: Payer: Managed Care, Other (non HMO) | Admitting: Nurse Practitioner

## 2021-06-26 VITALS — BP 116/84 | HR 76 | Temp 98.0°F | Resp 16 | Ht 65.0 in | Wt 189.0 lb

## 2021-06-26 DIAGNOSIS — Z6831 Body mass index (BMI) 31.0-31.9, adult: Secondary | ICD-10-CM

## 2021-06-26 DIAGNOSIS — G43119 Migraine with aura, intractable, without status migrainosus: Secondary | ICD-10-CM

## 2021-06-26 DIAGNOSIS — E6609 Other obesity due to excess calories: Secondary | ICD-10-CM

## 2021-06-26 DIAGNOSIS — I10 Essential (primary) hypertension: Secondary | ICD-10-CM

## 2021-06-26 MED ORDER — WEGOVY 1 MG/0.5ML ~~LOC~~ SOAJ: 1.0000 mg | SUBCUTANEOUS | 2 refills | Status: DC

## 2021-06-26 NOTE — Progress Notes (Signed)
[facility] ?[address] ?[place], [place] [address] ? ?Internal MEDICINE  ?Office Visit Note ? ?Patient Name: Barbara Stephens ? [id number]  ?[id number] ? ?Date of Service: 06/26/2021 ? ?Chief Complaint  ?Patient presents with  ? Follow-up  ? Medical Management of Chronic Issues  ?  Weigh management  ? ? ?HPI ?Barbara Stephens presents for a follow up visit for weight loss management and migraines. She has been taking mounjaro 7.5 mg weekly but the copay card is no longer working from [employer] and her insurance will not cover mounjaro for her. She would like to switch to something her insurance will cover.  ?Patient has lost 6 lbs since her previous office visit.  ?Her blood pressure is well controlled with current medications. She takes aimovig to prevent migraines and ubrelvy for acute migraine.  ? ? ? ?Current Medication: ?Outpatient Encounter Medications as of 06/26/2021  ?Medication Sig  ? ALPRAZolam (XANAX) 0.25 MG tablet Take 1 tablet (0.25 mg total) by mouth at bedtime as needed for anxiety.  ? busPIRone (BUSPAR) 10 MG tablet Take 1 tablet (10 mg total) by mouth 2 (two) times daily.  ? Erenumab-aooe (AIMOVIG) 140 MG/ML SOAJ Inject 140 mg into the skin every 30 (thirty) days.  ? esomeprazole (NEXIUM) 20 MG capsule Take 1 capsule (20 mg total) by mouth daily at 12 noon.  ? metoprolol succinate (TOPROL-XL) 25 MG 24 hr tablet Take 1 tablet (25 mg total) by mouth daily.  ? Semaglutide-Weight Management (WEGOVY) 1 MG/0.5ML SOAJ Inject 1 mg into the skin once a week.  ? Ubrogepant (UBRELVY) 100 MG TABS Take 100 mg by mouth daily as needed (acute migraine).  ? [DISCONTINUED] tirzepatide (MOUNJARO) 7.5 MG/0.5ML Pen Inject 7.5 mg into the skin once a week.  ? norethindrone-ethinyl estradiol (LOESTRIN) 1-20 MG-MCG tablet Take 1 tablet by mouth daily after supper.   ? ?No facility-administered encounter medications on file as of 06/26/2021.  ? ? ?Surgical History: ?Past Surgical History:  ?Procedure Laterality  Date  ? CESAREAN SECTION    ? COLONOSCOPY WITH PROPOFOL N/A 01/12/2021  ? Procedure: COLONOSCOPY WITH PROPOFOL;  Surgeon: Jonathon Bellows, MD;  Location: [facility] ENDOSCOPY;  Service: Gastroenterology;  Laterality: N/A;  ? ? ?Medical History: ?Past Medical History:  ?Diagnosis Date  ? Hypertension   ? Migraines   ? Premenopausal patient   ? ? ?Family History: ?Family History  ?Problem Relation Age of Onset  ? Thyroid disease Mother   ? Heart disease Mother   ? Lung cancer Father   ? Diabetes Father   ? Breast cancer Neg Hx   ? ? ?Social History  ? ?Socioeconomic History  ? Marital status: Divorced  ?  Spouse name: Not on file  ? Number of children: Not on file  ? Years of education: Not on file  ? Highest education level: Not on file  ?Occupational History  ? Not on file  ?Tobacco Use  ? Smoking status: Former  ?  Types: Cigarettes  ? Smokeless tobacco: Never  ?Vaping Use  ? Vaping Use: Former  ?Substance and Sexual Activity  ? Alcohol use: Yes  ?  Comment: occasionally  ? Drug use: Never  ? Sexual activity: Not on file  ?Other Topics Concern  ? Not on file  ?Social History Narrative  ? Not on file  ? ?Social Determinants of Health  ? ?Financial Resource Strain: Not on file  ?Food Insecurity: Not on file  ?Transportation Needs: Not on file  ?Physical Activity: Not  on file  ?Stress: Not on file  ?Social Connections: Not on file  ?Intimate Partner Violence: Not on file  ? ? ? ? ?Review of Systems  ?Constitutional:  Negative for chills, fatigue and unexpected weight change.  ?HENT:  Negative for congestion, rhinorrhea, sneezing and sore throat.   ?Eyes:  Negative for redness.  ?Respiratory:  Negative for cough, chest tightness and shortness of breath.   ?Cardiovascular:  Negative for chest pain and palpitations.  ?Gastrointestinal:  Negative for abdominal pain, constipation, diarrhea, nausea and vomiting.  ?Genitourinary:  Negative for dysuria and frequency.  ?Musculoskeletal:  Negative for arthralgias, back pain, joint  swelling and neck pain.  ?Skin:  Negative for rash.  ?Neurological:  Positive for headaches. Negative for tremors and numbness.  ?Hematological:  Negative for adenopathy. Does not bruise/bleed easily.  ?Psychiatric/Behavioral:  Negative for behavioral problems (Depression), sleep disturbance and suicidal ideas. The patient is not nervous/anxious.   ? ?Vital Signs: ?BP 116/84   Pulse 76   Temp 98 ?F (36.7 ?C)   Resp 16   Ht '5\' 5"'$  (1.651 m)   Wt 189 lb (85.7 kg)   SpO2 98%   BMI 31.45 kg/m?  ? ? ?Physical Exam ?Vitals reviewed.  ?Constitutional:   ?   General: She is not in acute distress. ?   Appearance: Normal appearance. She is obese. She is not ill-appearing.  ?HENT:  ?   Head: Normocephalic and atraumatic.  ?Eyes:  ?   Pupils: Pupils are equal, round, and reactive to light.  ?Cardiovascular:  ?   Rate and Rhythm: Normal rate and regular rhythm.  ?Pulmonary:  ?   Effort: Pulmonary effort is normal. No respiratory distress.  ?Neurological:  ?   Mental Status: She is alert and oriented to person, place, and time.  ?   Gait: Gait normal.  ?Psychiatric:     ?   Mood and Affect: Mood normal.     ?   Behavior: Behavior normal.  ? ? ? ? ? ?Assessment/Plan: ?1. Essential hypertension ?Continue current medication as prescribed.  ? ?2. Intractable migraine with aura without status migrainosus ?Continue current medication as prescribed.  ? ?3. Morbid obesity due to excess calories ([place]) ?Mounjaro discontinued. Start wegovy at 1 mg weekly. Will most likely need a prior auth. ?- Semaglutide-Weight Management (WEGOVY) 1 MG/0.5ML SOAJ; Inject 1 mg into the skin once a week.  Dispense: 2 mL; Refill: 2 ? ?4. Class 1 obesity due to excess calories with serious comorbidity and body mass index (BMI) of 31.0 to 31.9 in adult ?See problem #3 ?- Semaglutide-Weight Management (WEGOVY) 1 MG/0.5ML SOAJ; Inject 1 mg into the skin once a week.  Dispense: 2 mL; Refill: 2 ? ? ? ?General Counseling: [name] verbalizes understanding of the  findings of todays visit and agrees with plan of treatment. I have discussed any further diagnostic evaluation that may be needed or ordered today. We also reviewed her medications today. she has been encouraged to call the office with any questions or concerns that should arise related to todays visit. ? ? ? ?No orders of the defined types were placed in this encounter. ? ? ?Meds ordered this encounter  ?Medications  ? Semaglutide-Weight Management (WEGOVY) 1 MG/0.5ML SOAJ  ?  Sig: Inject 1 mg into the skin once a week.  ?  Dispense:  2 mL  ?  Refill:  2  ?  Dx code E81.01; discontinue mounjaro, start wegovy, may need prior auth, please let us know and  we will complete it.  ? ? ?Return in about 1 month (around 07/27/2021) for F/U, Weight loss, Peytin Dechert PCP. ? ? ?Total time spent:30 Minutes ?Time spent includes review of chart, medications, test results, and follow up plan with the patient.  ? ?[place] Controlled Substance Database was reviewed by me. ? ?This patient was seen by Jonetta Osgood, FNP-C in collaboration with Dr. Clayborn Bigness as a part of collaborative care agreement. ? ? ?Rayman Petrosian R. Valetta Fuller, MSN, FNP-C ?Internal medicine  ?

## 2021-06-27 ENCOUNTER — Encounter: Payer: Self-pay | Admitting: Nurse Practitioner

## 2021-07-09 ENCOUNTER — Telehealth: Payer: Self-pay

## 2021-07-09 NOTE — Telephone Encounter (Signed)
PA for WEGOVY 1 mg sent 07/09/21 @ 355 pm ?

## 2021-07-10 ENCOUNTER — Telehealth: Payer: Self-pay

## 2021-07-10 DIAGNOSIS — E6609 Other obesity due to excess calories: Secondary | ICD-10-CM

## 2021-07-10 MED ORDER — WEGOVY 1 MG/0.5ML ~~LOC~~ SOAJ: 1.0000 mg | SUBCUTANEOUS | 2 refills | Status: DC

## 2021-07-10 NOTE — Telephone Encounter (Signed)
[employer] sent fax informing that patient was approved for Wegovy Inj 2.4 mg on 07/09/21 valid thru 02/02/22 .  Sent new rx to pharmacy ?

## 2021-07-18 ENCOUNTER — Telehealth: Payer: Self-pay

## 2021-07-18 DIAGNOSIS — G43119 Migraine with aura, intractable, without status migrainosus: Secondary | ICD-10-CM

## 2021-07-18 MED ORDER — AIMOVIG 140 MG/ML ~~LOC~~ SOAJ: 140.0000 mg | SUBCUTANEOUS | 3 refills | Status: DC

## 2021-07-18 NOTE — Telephone Encounter (Signed)
Barbara Stephens pt gets through [facility], insurance does not cover.  ?

## 2021-07-31 ENCOUNTER — Ambulatory Visit: Payer: Managed Care, Other (non HMO) | Admitting: Nurse Practitioner

## 2021-08-02 ENCOUNTER — Ambulatory Visit: Payer: Managed Care, Other (non HMO) | Admitting: Nurse Practitioner

## 2021-08-02 ENCOUNTER — Encounter: Payer: Self-pay | Admitting: Nurse Practitioner

## 2021-08-02 VITALS — BP 125/80 | HR 87 | Temp 98.5°F | Resp 16 | Ht 65.0 in | Wt 188.6 lb

## 2021-08-02 DIAGNOSIS — E6609 Other obesity due to excess calories: Secondary | ICD-10-CM | POA: Diagnosis not present

## 2021-08-02 DIAGNOSIS — I10 Essential (primary) hypertension: Secondary | ICD-10-CM | POA: Diagnosis not present

## 2021-08-02 DIAGNOSIS — Z6831 Body mass index (BMI) 31.0-31.9, adult: Secondary | ICD-10-CM

## 2021-08-02 DIAGNOSIS — G43119 Migraine with aura, intractable, without status migrainosus: Secondary | ICD-10-CM

## 2021-08-02 MED ORDER — AIMOVIG 140 MG/ML ~~LOC~~ SOAJ: 140.0000 mg | SUBCUTANEOUS | 3 refills | Status: DC

## 2021-08-02 MED ORDER — SEMAGLUTIDE-WEIGHT MANAGEMENT 1.7 MG/0.75ML ~~LOC~~ SOAJ: 1.7000 mg | SUBCUTANEOUS | 3 refills | Status: DC

## 2021-08-02 NOTE — Progress Notes (Signed)
[facility] [place], [place] [address]  Internal MEDICINE  Office Visit Note  Patient Name: Barbara Stephens  [id number]  [id number]  Date of Service: 08/02/2021  Chief Complaint  Patient presents with   Follow-up    Discuss meds   Hypertension   Weight Loss    HPI Barbara Stephens presents for follow-up visit for hypertension, migraines and weight loss management.  For her migraines she was originally approved for Aimovig, she is now having issues with getting it at an affordable price even with the co-pay coupon, Aimovig sample provided for today. Her blood pressure continues to remain well controlled with current medications. At her previous office visit, she had been using Mounjaro for weight loss using the co-pay coupon which is no longer being accepted with her insurance and pharmacy so she is in need of switching to a different medication and she was switched to [facility] 1 mg weekly which seem to be pretty equivalent to the dose of Mounjaro she was on.  She has lost approximately 1 pound since her previous office visit and is interested in increasing the dose of Wegovy to the 1.7 mg dose   Current Medication: Outpatient Encounter Medications as of 08/02/2021  Medication Sig   ALPRAZolam (XANAX) 0.25 MG tablet Take 1 tablet (0.25 mg total) by mouth at bedtime as needed for anxiety.   busPIRone (BUSPAR) 10 MG tablet Take 1 tablet (10 mg total) by mouth 2 (two) times daily.   esomeprazole (NEXIUM) 20 MG capsule Take 1 capsule (20 mg total) by mouth daily at 12 noon.   metoprolol succinate (TOPROL-XL) 25 MG 24 hr tablet Take 1 tablet (25 mg total) by mouth daily.   Semaglutide-Weight Management 1.7 MG/0.75ML SOAJ Inject 1.7 mg into the skin once a week.   Ubrogepant (UBRELVY) 100 MG TABS Take 100 mg by mouth daily as needed (acute migraine).   [DISCONTINUED] Erenumab-aooe (AIMOVIG) 140 MG/ML SOAJ Inject 140 mg into the skin every 30 (thirty) days.   [DISCONTINUED]  Semaglutide-Weight Management (WEGOVY) 1 MG/0.5ML SOAJ Inject 1 mg into the skin once a week.   Erenumab-aooe (AIMOVIG) 140 MG/ML SOAJ Inject 140 mg into the skin every 30 (thirty) days.   norethindrone-ethinyl estradiol (LOESTRIN) 1-20 MG-MCG tablet Take 1 tablet by mouth daily after supper.    No facility-administered encounter medications on file as of 08/02/2021.    Surgical History: Past Surgical History:  Procedure Laterality Date   CESAREAN SECTION     COLONOSCOPY WITH PROPOFOL N/A 01/12/2021   Procedure: COLONOSCOPY WITH PROPOFOL;  Surgeon: Jonathon Bellows, MD;  Location: [facility] ENDOSCOPY;  Service: Gastroenterology;  Laterality: N/A;    Medical History: Past Medical History:  Diagnosis Date   Hypertension    Migraines    Premenopausal patient     Family History: Family History  Problem Relation Age of Onset   Thyroid disease Mother    Heart disease Mother    Lung cancer Father    Diabetes Father    Breast cancer Neg Hx     Social History   Socioeconomic History   Marital status: Divorced    Spouse name: Not on file   Number of children: Not on file   Years of education: Not on file   Highest education level: Not on file  Occupational History   Not on file  Tobacco Use   Smoking status: Former    Types: Cigarettes   Smokeless tobacco: Never  Vaping Use   Vaping Use: Former  Substance and Sexual Activity   Alcohol use: Yes    Comment: occasionally   Drug use: Never   Sexual activity: Not on file  Other Topics Concern   Not on file  Social History Narrative   Not on file   Social Determinants of Health   Financial Resource Strain: Not on file  Food Insecurity: Not on file  Transportation Needs: Not on file  Physical Activity: Not on file  Stress: Not on file  Social Connections: Not on file  Intimate Partner Violence: Not on file      Review of Systems  Constitutional:  Negative for chills, fatigue and unexpected weight change.  HENT:  Negative  for congestion, rhinorrhea, sneezing and sore throat.   Eyes:  Negative for redness.  Respiratory:  Negative for cough, chest tightness and shortness of breath.   Cardiovascular:  Negative for chest pain and palpitations.  Gastrointestinal:  Negative for abdominal pain, constipation, diarrhea, nausea and vomiting.  Genitourinary:  Negative for dysuria and frequency.  Musculoskeletal:  Negative for arthralgias, back pain, joint swelling and neck pain.  Skin:  Negative for rash.  Neurological: Negative.  Negative for tremors and numbness.  Hematological:  Negative for adenopathy. Does not bruise/bleed easily.  Psychiatric/Behavioral:  Negative for behavioral problems (Depression), sleep disturbance and suicidal ideas. The patient is not nervous/anxious.    Vital Signs: BP 125/80   Pulse 87   Temp 98.5 F (36.9 C)   Resp 16   Ht '5\' 5"'$  (1.651 m)   Wt 188 lb 9.6 oz (85.5 kg)   SpO2 98%   BMI 31.38 kg/m    Physical Exam Vitals reviewed.  Constitutional:      General: She is not in acute distress.    Appearance: Normal appearance. She is obese. She is not ill-appearing.  HENT:     Head: Normocephalic and atraumatic.  Eyes:     Pupils: Pupils are equal, round, and reactive to light.  Cardiovascular:     Rate and Rhythm: Normal rate and regular rhythm.  Pulmonary:     Effort: Pulmonary effort is normal. No respiratory distress.  Neurological:     Mental Status: She is alert and oriented to person, place, and time.  Psychiatric:        Mood and Affect: Mood normal.        Behavior: Behavior normal.       Assessment/Plan: 1. Essential hypertension Stable well-controlled with current medications  2. Intractable migraine with aura without status migrainosus 1 sample of Aimovig provided to patient, refills ordered. - Erenumab-aooe (AIMOVIG) 140 MG/ML SOAJ; Inject 140 mg into the skin every 30 (thirty) days.  Dispense: 3 mL; Refill: 3  3. Morbid obesity due to excess calories  ([facility]) Wegovy dose increased to 1.7 mg weekly, prior authorization was already approved at previous dose - Semaglutide-Weight Management 1.7 MG/0.75ML SOAJ; Inject 1.7 mg into the skin once a week.  Dispense: 3 mL; Refill: 3  4. Class 1 obesity due to excess calories with serious comorbidity and body mass index (BMI) of 31.0 to 31.9 in adult See problem #3 - Semaglutide-Weight Management 1.7 MG/0.75ML SOAJ; Inject 1.7 mg into the skin once a week.  Dispense: 3 mL; Refill: 3   General Counseling: Brinlyn verbalizes understanding of the findings of todays visit and agrees with plan of treatment. I have discussed any further diagnostic evaluation that may be needed or ordered today. We also reviewed her medications today. she has been encouraged to call the  office with any questions or concerns that should arise related to todays visit.    No orders of the defined types were placed in this encounter.   Meds ordered this encounter  Medications   Erenumab-aooe (AIMOVIG) 140 MG/ML SOAJ    Sig: Inject 140 mg into the skin every 30 (thirty) days.    Dispense:  3 mL    Refill:  3    PA was approved 01/20/2021 valid from 11/01/20 to 01/20/2022   Semaglutide-Weight Management 1.7 MG/0.75ML SOAJ    Sig: Inject 1.7 mg into the skin once a week.    Dispense:  3 mL    Refill:  3    Prior approval was already done.    Return in about 2 months (around 10/02/2021) for F/U, Weight loss, Kaitlan Bin PCP.   Total time spent:30 Minutes Time spent includes review of chart, medications, test results, and follow up plan with the patient.   [place] Controlled Substance Database was reviewed by me.  This patient was seen by Jonetta Osgood, FNP-C in collaboration with Dr. Clayborn Bigness as a part of collaborative care agreement.   Jaquay Posthumus R. Valetta Fuller, MSN, FNP-C Internal medicine

## 2021-08-15 ENCOUNTER — Ambulatory Visit: Payer: Managed Care, Other (non HMO)

## 2021-08-22 ENCOUNTER — Other Ambulatory Visit: Payer: Self-pay | Admitting: Nurse Practitioner

## 2021-08-22 DIAGNOSIS — G43119 Migraine with aura, intractable, without status migrainosus: Secondary | ICD-10-CM

## 2021-08-28 ENCOUNTER — Emergency Department: Payer: Managed Care, Other (non HMO)

## 2021-08-28 ENCOUNTER — Other Ambulatory Visit: Payer: Self-pay

## 2021-08-28 ENCOUNTER — Emergency Department
Admission: EM | Admit: 2021-08-28 | Discharge: 2021-08-28 | Disposition: A | Discharge status: Home / Self Care | Payer: Managed Care, Other (non HMO) | Attending: Emergency Medicine | Admitting: Emergency Medicine

## 2021-08-28 DIAGNOSIS — I10 Essential (primary) hypertension: Secondary | ICD-10-CM | POA: Diagnosis not present

## 2021-08-28 DIAGNOSIS — K5900 Constipation, unspecified: Secondary | ICD-10-CM

## 2021-08-28 DIAGNOSIS — Z85858 Personal history of malignant neoplasm of other endocrine glands: Secondary | ICD-10-CM | POA: Diagnosis not present

## 2021-08-28 DIAGNOSIS — K5641 Fecal impaction: Secondary | ICD-10-CM | POA: Diagnosis not present

## 2021-08-28 MED ORDER — MILK AND MOLASSES ENEMA
1.0000 | Freq: Once | RECTAL | Status: AC
  Administered 2021-08-28: 240 mL via RECTAL
  Filled 2021-08-28: qty 240

## 2021-08-28 MED ORDER — HYDROCORTISONE ACETATE 25 MG RE SUPP
25.0000 mg | Freq: Once | RECTAL | Status: AC
  Administered 2021-08-28: 25 mg via RECTAL
  Filled 2021-08-28: qty 1

## 2021-08-28 MED ORDER — IBUPROFEN 600 MG PO TABS
600.0000 mg | ORAL_TABLET | Freq: Once | ORAL | Status: AC
  Administered 2021-08-28: 600 mg via ORAL
  Filled 2021-08-28: qty 1

## 2021-08-28 MED ORDER — HYDROCORTISONE ACE-PRAMOXINE 1-1 % EX CREA
TOPICAL_CREAM | Freq: Three times a day (TID) | CUTANEOUS | 0 refills | Status: AC | PRN
Start: 2021-08-28 — End: 2021-09-03

## 2021-08-28 MED ORDER — ACETAMINOPHEN 500 MG PO TABS
1000.0000 mg | ORAL_TABLET | Freq: Once | ORAL | Status: AC
  Administered 2021-08-28: 1000 mg via ORAL
  Filled 2021-08-28: qty 2

## 2021-08-28 MED ORDER — POLYETHYLENE GLYCOL 3350 17 G PO PACK
17.0000 g | PACK | Freq: Every day | ORAL | Status: DC
  Administered 2021-08-28: 17 g via ORAL
  Filled 2021-08-28: qty 1

## 2021-08-28 NOTE — ED Provider Triage Note (Signed)
Emergency Medicine Provider Triage Evaluation Note  Barbara Stephens , a 46 y.o. female  was evaluated in triage.  Pt complains of constipation.  Has used multiple enemas without any relief..  Review of Systems  Positive: Constipation Negative: Or chills  Physical Exam  BP (!) 151/100 (BP Location: Left Arm)   Pulse (!) 112   Temp (!) 97.5 F (36.4 C) (Oral)   Resp 20   Ht '5\' 5"'$  (1.651 m)   Wt 83 kg   SpO2 100%   BMI 30.45 kg/m  Gen:   Awake, no distress   Resp:  Normal effort  MSK:   Moves extremities without difficulty  Other:    Medical Decision Making  Medically screening exam initiated at 1:17 PM.  Appropriate orders placed.  Barbara Stephens was informed that the remainder of the evaluation will be completed by another provider, this initial triage assessment does not replace that evaluation, and the importance of remaining in the ED until their evaluation is complete.  We will do a abdomen x-ray to assess stool burden   Versie Starks, PA-C 08/28/21 1318

## 2021-08-28 NOTE — ED Provider Notes (Signed)
[facility] Provider Note    None    (approximate)   History   Chief Complaint Constipation   HPI Barbara Stephens is a 46 y.o. female, history of obesity, hypertension, GAD, migraine, pituitary adenoma, presents to the emergency department for evaluation of constipation.  Patient states she has not had any bowel movement for approximately 7 days.  Reports a history of constipation, though not usually this bad.  No recent illnesses or injuries.  No recent surgeries.  Denies starting any new medications.  Patient reports having some small liquid bowel movement after an enema at home, though has not had significant relief.  Denies fever/chills, chest pain, shortness of breath, abdominal pain, flank pain, nausea/vomiting, dysuria hematochezia, or numbness/tingling in upper or lower extremities  History Limitations: No limitations.        Physical Exam  Triage Vital Signs: ED Triage Vitals  Enc Vitals Group     BP 08/28/21 1316 (!) 151/100     Pulse Rate 08/28/21 1316 (!) 112     Resp 08/28/21 1316 20     Temp 08/28/21 1316 (!) 97.5 F (36.4 C)     Temp Source 08/28/21 1316 Oral     SpO2 08/28/21 1316 100 %     Weight 08/28/21 1317 183 lb (83 kg)     Height 08/28/21 1317 '5\' 5"'$  (1.651 m)     Head Circumference --      Peak Flow --      Pain Score 08/28/21 1317 0     Pain Loc --      Pain Edu? --      Excl. in [place]? --     Most recent vital signs: Vitals:   08/28/21 1316 08/28/21 1857  BP: (!) 151/100 (!) 148/98  Pulse: (!) 112 100  Resp: 20 18  Temp: (!) 97.5 F (36.4 C)   SpO2: 100% 100%    General: Awake, NAD.  Skin: Warm, dry. No rashes or lesions.  Eyes: PERRL. Conjunctivae normal.  CV: Good peripheral perfusion.  Resp: Normal effort.  Abd: Soft, non-tender. No distention.  Neuro: At baseline. No gross neurological deficits.   Focused Exam: Not applicable.  Physical Exam    ED Results / Procedures / Treatments  Labs (all labs  ordered are listed, but only abnormal results are displayed) Labs Reviewed - No data to display   EKG N/A.   RADIOLOGY  ED Provider Interpretation: I personally reviewed this x-ray, moderate mount of stool seen in the colon and rectum.  No significant small bowel dilation.  DG Abdomen 1 View  Result Date: 08/28/2021 CLINICAL DATA:  Constipation x1 week EXAM: ABDOMEN - 1 VIEW COMPARISON:  None FINDINGS: Bowel gas pattern is nonspecific. There is no significant small bowel dilation. Moderate amount of stool is seen in the colon and rectum. No abnormal masses or calcifications are seen in the abdomen. Kidneys are partly obscured by bowel contents. Visualized lower lung fields are clear. IMPRESSION: Nonspecific bowel gas pattern. Moderate amount of stool is seen in the colon and rectum. Electronically Signed   By: Elmer Picker M.D.   On: 08/28/2021 13:50    PROCEDURES:  Critical Care performed: N/A  Procedures    MEDICATIONS ORDERED IN ED: Medications  milk and molasses enema (240 mLs Rectal Given 08/28/21 1752)  acetaminophen (TYLENOL) tablet 1,000 mg (1,000 mg Oral Given 08/28/21 1751)  ibuprofen (ADVIL) tablet 600 mg (600 mg Oral Given 08/28/21 1751)  hydrocortisone (ANUSOL-HC) suppository  25 mg (25 mg Rectal Given 08/28/21 2146)     IMPRESSION / MDM / ASSESSMENT AND PLAN / ED COURSE  I reviewed the triage vital signs and the nursing notes.                              Differential diagnosis includes, but is not limited to, fecal impaction, constipation, bowel obstruction  ED Course Patient appears well, vitals within normal limits for the patient.  NAD.  We will go ahead treat with MiraLAX and milk of molasses enema.  Patient unable to tolerate all of the milk molasses enema, though did have a small bowel movement following initial treatment.  Patient requesting to try the rest of the enema after a brief period of rest.  Assessment/Plan Presentation consistent with  benign constipation.  X-ray confirms moderate stool burden, no evidence of any serious or life-threatening pathology.  Mild success with MiraLAX and partial milk/molasses enema.  Pending second trial.  We will transition this patient's care over to [facility], PA-C at approximately 2000.       FINAL CLINICAL IMPRESSION(S) / ED DIAGNOSES   Final diagnoses:  Constipation, unspecified constipation type  Fecal impaction in rectum ([place])     Rx / DC Orders   ED Discharge Orders          Ordered    pramoxine-hydrocortisone (PROCTOCREAM-HC) 1-1 % rectal cream  Every 8 hours PRN        08/28/21 2130             Note:  This document was prepared using Dragon voice recognition software and may include unintentional dictation errors.   Teodoro Spray, [place] 08/29/21 1109    Naaman Plummer, MD 08/29/21 [phone]

## 2021-08-28 NOTE — Discharge Instructions (Addendum)
Use MiraLAX daily as a stool softener.  Use the Proctofoam cream as needed.  Increase your daily fiber intake.  Follow-up with primary provider for ongoing symptoms.  Return to the ED if needed.

## 2021-08-28 NOTE — ED Provider Notes (Signed)
-----------------------------------------   9:31 PM on 08/28/2021 -----------------------------------------  Blood pressure (!) 148/98, pulse 100, temperature (!) 97.5 F (36.4 C), temperature source Oral, resp. rate 18, height '5\' 5"'$  (1.651 m), weight 83 kg, SpO2 100 %.  Assuming care from Mardee Postin, PA-C/NP-C.  In short, NORMAN BIER is a 46 y.o. female with a chief complaint of Constipation .  Refer to the original H&P for additional details.  The current plan of care is to reevaluate patient in the interim. ____________________________________________   RADIOLOGY  DG ABD 1 View    IMPRESSION: Nonspecific bowel gas pattern. Moderate amount of stool is seen in the colon and rectum. ____________________________________________  PROCEDURES  Anusol rectal suppository  Fecal disimpaction  Date/Time: 08/28/2021 9:32 PM Performed by: Melvenia Needles, PA-C Authorized by: Melvenia Needles, PA-C  Consent: Verbal consent obtained. Written consent not obtained. Risks and benefits: risks, benefits and alternatives were discussed Consent given by: patient Patient understanding: patient states understanding of the procedure being performed Patient consent: the patient's understanding of the procedure matches consent given Imaging studies: imaging studies available Patient identity confirmed: verbally with patient Time out: Immediately prior to procedure a "time out" was called to verify the correct patient, procedure, equipment, support staff and site/side marked as required. Local anesthesia used: no  Anesthesia: Local anesthesia used: no  Sedation: Patient sedated: no  Patient tolerance: patient tolerated the procedure well with no immediate complications Comments: Manual disimpaction of firm brown in the rectal vault.   ____________________________________________  INITIAL IMPRESSION / ASSESSMENT AND PLAN / ED COURSE  Patient to the ED with  complaints of 1 week of constipation with rectal pain and pressure.  She is evaluated for complaints in ED, found to have evidence of moderate stool burden and firm stool in the rectal vault.  Patient consents to manual disimpaction after MiraLAX and enemas provided in the ED.  Meaningful removal of firm stool ball during procedure.  Patient reports subjective improvement of her pain and pressure.  To discharge at this time stable condition.  She is encouraged to increase fiber intake, utilize daily stool softeners.  She will follow with primary provider return to the ED if needed.  ELLANORA RAYBORN was evaluated in Emergency Department on 08/28/2021 for the symptoms described in the history of present illness. She was evaluated in the context of the global COVID-19 pandemic, which necessitated consideration that the patient might be at risk for infection with the SARS-CoV-2 virus that causes COVID-19. Institutional protocols and algorithms that pertain to the evaluation of patients at risk for COVID-19 are in a state of rapid change based on information released by regulatory bodies including the CDC and federal and state organizations. These policies and algorithms were followed during the patient's care in the ED.  ____________________________________________  FINAL CLINICAL IMPRESSION(S) / ED DIAGNOSES  Final diagnoses:  Constipation, unspecified constipation type  Fecal impaction in rectum [facility])      Cailie Bosshart, Dannielle Karvonen, PA-C 08/28/21 2135    Naaman Plummer, MD 08/29/21 1521

## 2021-08-28 NOTE — ED Triage Notes (Signed)
Pt here with constipation x1 week. Pt has tried enemas at home with no relief. Pt states her last BM was 1 week ago.

## 2021-08-28 NOTE — ED Notes (Signed)
Min relief with small amt of enema

## 2021-09-10 ENCOUNTER — Encounter: Payer: Self-pay | Admitting: Nurse Practitioner

## 2021-10-04 ENCOUNTER — Ambulatory Visit: Payer: Managed Care, Other (non HMO) | Admitting: Nurse Practitioner

## 2021-10-04 ENCOUNTER — Encounter: Payer: Self-pay | Admitting: Nurse Practitioner

## 2021-10-04 VITALS — BP 129/87 | HR 89 | Temp 98.6°F | Resp 16 | Ht 65.0 in | Wt 183.8 lb

## 2021-10-04 DIAGNOSIS — E6609 Other obesity due to excess calories: Secondary | ICD-10-CM | POA: Diagnosis not present

## 2021-10-04 DIAGNOSIS — G43109 Migraine with aura, not intractable, without status migrainosus: Secondary | ICD-10-CM | POA: Diagnosis not present

## 2021-10-04 DIAGNOSIS — I10 Essential (primary) hypertension: Secondary | ICD-10-CM

## 2021-10-04 DIAGNOSIS — K5903 Drug induced constipation: Secondary | ICD-10-CM | POA: Diagnosis not present

## 2021-10-04 DIAGNOSIS — Z6830 Body mass index (BMI) 30.0-30.9, adult: Secondary | ICD-10-CM

## 2021-10-04 MED ORDER — DIETHYLPROPION HCL ER 75 MG PO TB24: 75.0000 mg | ORAL_TABLET | Freq: Every day | ORAL | 0 refills | Status: DC

## 2021-10-04 MED ORDER — ZOLMITRIPTAN 5 MG PO TBDP
5.0000 mg | ORAL_TABLET | ORAL | 0 refills | Status: DC | PRN
Start: 2021-10-04 — End: 2022-01-02

## 2021-10-04 MED ORDER — LUBIPROSTONE 24 MCG PO CAPS: 24.0000 ug | ORAL_CAPSULE | Freq: Two times a day (BID) | ORAL | 2 refills | Status: DC

## 2021-10-04 NOTE — Progress Notes (Signed)
[facility] [place], [place] [address]  Internal MEDICINE  Office Visit Note  Patient Name: Barbara Stephens  [id number]  [id number]  Date of Service: 10/04/2021  Chief Complaint  Patient presents with   Follow-up   Hypertension   Weight Loss   Medication Refill    HPI Barbara Stephens presents for follow-up visit for hypertension, migraines and weight loss management.  For her migraines she was originally approved for Aimovig, but the copay is much higher now. She is willing to try other medications. She has previously tried topiramate, propranolol, aimovig and emgality for migraine prevention. She has not tried amitriptyline, venlafaxine or qulipta yet. Her blood pressure continues to remain well controlled with current medications. At her previous office visit, her wegovy dose was increased to 1.7 mg but she has not lost any weight since increasing the dose. She feels like her appetite has increased with wegovy and has been more constipated while taking wegovy.  She would like to try something else for weight loss.     Current Medication: Outpatient Encounter Medications as of 10/04/2021  Medication Sig   ALPRAZolam (XANAX) 0.25 MG tablet Take 1 tablet (0.25 mg total) by mouth at bedtime as needed for anxiety.   busPIRone (BUSPAR) 10 MG tablet Take 1 tablet (10 mg total) by mouth 2 (two) times daily.   Diethylpropion HCl CR 75 MG TB24 Take 1 tablet (75 mg total) by mouth daily.   Erenumab-aooe (AIMOVIG) 140 MG/ML SOAJ Inject 140 mg into the skin every 30 (thirty) days.   esomeprazole (NEXIUM) 20 MG capsule Take 1 capsule (20 mg total) by mouth daily at 12 noon.   lubiprostone (AMITIZA) 24 MCG capsule Take 1 capsule (24 mcg total) by mouth 2 (two) times daily with a meal.   metoprolol succinate (TOPROL-XL) 25 MG 24 hr tablet Take 1 tablet (25 mg total) by mouth daily.   Ubrogepant (UBRELVY) 100 MG TABS Take 100 mg by mouth daily as needed (acute migraine).    zolmitriptan (ZOMIG-ZMT) 5 MG disintegrating tablet Take 1 tablet (5 mg total) by mouth as needed for migraine.   [DISCONTINUED] Semaglutide-Weight Management 1.7 MG/0.75ML SOAJ Inject 1.7 mg into the skin once a week.   norethindrone-ethinyl estradiol (LOESTRIN) 1-20 MG-MCG tablet Take 1 tablet by mouth daily after supper.    No facility-administered encounter medications on file as of 10/04/2021.    Surgical History: Past Surgical History:  Procedure Laterality Date   CESAREAN SECTION     COLONOSCOPY WITH PROPOFOL N/A 01/12/2021   Procedure: COLONOSCOPY WITH PROPOFOL;  Surgeon: Jonathon Bellows, MD;  Location: [facility] ENDOSCOPY;  Service: Gastroenterology;  Laterality: N/A;    Medical History: Past Medical History:  Diagnosis Date   Hypertension    Migraines    Premenopausal patient     Family History: Family History  Problem Relation Age of Onset   Thyroid disease Mother    Heart disease Mother    Lung cancer Father    Diabetes Father    Breast cancer Neg Hx     Social History   Socioeconomic History   Marital status: Divorced    Spouse name: Not on file   Number of children: Not on file   Years of education: Not on file   Highest education level: Not on file  Occupational History   Not on file  Tobacco Use   Smoking status: Former    Types: Cigarettes   Smokeless tobacco: Never  Vaping Use   Vaping  Use: Former  Substance and Sexual Activity   Alcohol use: Yes    Comment: occasionally   Drug use: Never   Sexual activity: Not on file  Other Topics Concern   Not on file  Social History Narrative   Not on file   Social Determinants of Health   Financial Resource Strain: Not on file  Food Insecurity: Not on file  Transportation Needs: Not on file  Physical Activity: Not on file  Stress: Not on file  Social Connections: Not on file  Intimate Partner Violence: Not on file      Review of Systems  Constitutional:  Negative for chills, fatigue and unexpected  weight change.  HENT:  Negative for congestion, rhinorrhea, sneezing and sore throat.   Eyes:  Negative for redness.  Respiratory:  Negative for cough, chest tightness and shortness of breath.   Cardiovascular:  Negative for chest pain and palpitations.  Gastrointestinal:  Negative for abdominal pain, constipation, diarrhea, nausea and vomiting.  Genitourinary:  Negative for dysuria and frequency.  Musculoskeletal:  Negative for arthralgias, back pain, joint swelling and neck pain.  Skin:  Negative for rash.  Neurological: Negative.  Negative for tremors and numbness.  Hematological:  Negative for adenopathy. Does not bruise/bleed easily.  Psychiatric/Behavioral:  Negative for behavioral problems (Depression), sleep disturbance and suicidal ideas. The patient is not nervous/anxious.     Vital Signs: BP 129/87   Pulse 89   Temp 98.6 F (37 C)   Resp 16   Ht '5\' 5"'$  (1.651 m)   Wt 183 lb 12.8 oz (83.4 kg)   SpO2 98%   BMI 30.59 kg/m    Physical Exam Vitals reviewed.  Constitutional:      General: She is not in acute distress.    Appearance: Normal appearance. She is obese. She is not ill-appearing.  HENT:     Head: Normocephalic and atraumatic.  Eyes:     Pupils: Pupils are equal, round, and reactive to light.  Cardiovascular:     Rate and Rhythm: Normal rate and regular rhythm.  Pulmonary:     Effort: Pulmonary effort is normal. No respiratory distress.  Neurological:     Mental Status: She is alert and oriented to person, place, and time.  Psychiatric:        Mood and Affect: Mood normal.        Behavior: Behavior normal.        Assessment/Plan: 1. Essential hypertension Stable and well controlled with current medication  2. Migraine with aura and without status migrainosus, not intractable Zolmitriptan refills ordered.  - zolmitriptan (ZOMIG-ZMT) 5 MG disintegrating tablet; Take 1 tablet (5 mg total) by mouth as needed for migraine.  Dispense: 10 tablet;  Refill: 0  3. Drug-induced constipation Will try amitiza  - lubiprostone (AMITIZA) 24 MCG capsule; Take 1 capsule (24 mcg total) by mouth 2 (two) times daily with a meal.  Dispense: 60 capsule; Refill: 2  4. Class 1 obesity due to excess calories with serious comorbidity and body mass index (BMI) of 30.0 to 30.9 in adult Wegovy discontinued, will try diethylpropion, refills ordered.  - Diethylpropion HCl CR 75 MG TB24; Take 1 tablet (75 mg total) by mouth daily.  Dispense: 30 tablet; Refill: 0     General Counseling: Barbara Stephens verbalizes understanding of the findings of todays visit and agrees with plan of treatment. I have discussed any further diagnostic evaluation that may be needed or ordered today. We also reviewed her medications today. she has  been encouraged to call the office with any questions or concerns that should arise related to todays visit.    No orders of the defined types were placed in this encounter.   Meds ordered this encounter  Medications   lubiprostone (AMITIZA) 24 MCG capsule    Sig: Take 1 capsule (24 mcg total) by mouth 2 (two) times daily with a meal.    Dispense:  60 capsule    Refill:  2   zolmitriptan (ZOMIG-ZMT) 5 MG disintegrating tablet    Sig: Take 1 tablet (5 mg total) by mouth as needed for migraine.    Dispense:  10 tablet    Refill:  0   Diethylpropion HCl CR 75 MG TB24    Sig: Take 1 tablet (75 mg total) by mouth daily.    Dispense:  30 tablet    Refill:  0    Return in about 1 month (around 11/03/2021) for F/U, Weight loss, Barbara Stephens PCP.   Total time spent:30 Minutes Time spent includes review of chart, medications, test results, and follow up plan with the patient.   [place] Controlled Substance Database was reviewed by me.  This patient was seen by Jonetta Osgood, FNP-C in collaboration with Dr. Clayborn Bigness as a part of collaborative care agreement.   Shabreka Coulon R. Valetta Fuller, MSN, FNP-C Internal medicine

## 2021-10-16 ENCOUNTER — Telehealth: Payer: Self-pay

## 2021-10-16 NOTE — Telephone Encounter (Signed)
PA for LUBIPROSTONE 24 MCG capsules was sent 10/16/21 @ 12:12 pm

## 2021-10-27 ENCOUNTER — Other Ambulatory Visit: Payer: Self-pay | Admitting: Nurse Practitioner

## 2021-10-27 DIAGNOSIS — I10 Essential (primary) hypertension: Secondary | ICD-10-CM

## 2021-11-08 ENCOUNTER — Ambulatory Visit: Payer: Managed Care, Other (non HMO) | Admitting: Nurse Practitioner

## 2021-11-08 ENCOUNTER — Encounter: Payer: Self-pay | Admitting: Nurse Practitioner

## 2021-11-08 VITALS — BP 136/88 | HR 80 | Temp 98.8°F | Resp 16 | Ht 66.0 in | Wt 187.0 lb

## 2021-11-08 DIAGNOSIS — E786 Lipoprotein deficiency: Secondary | ICD-10-CM

## 2021-11-08 DIAGNOSIS — E6609 Other obesity due to excess calories: Secondary | ICD-10-CM | POA: Diagnosis not present

## 2021-11-08 DIAGNOSIS — Z6830 Body mass index (BMI) 30.0-30.9, adult: Secondary | ICD-10-CM

## 2021-11-08 DIAGNOSIS — G43109 Migraine with aura, not intractable, without status migrainosus: Secondary | ICD-10-CM

## 2021-11-08 DIAGNOSIS — E782 Mixed hyperlipidemia: Secondary | ICD-10-CM | POA: Diagnosis not present

## 2021-11-08 DIAGNOSIS — Z9189 Other specified personal risk factors, not elsewhere classified: Secondary | ICD-10-CM

## 2021-11-08 MED ORDER — SEMAGLUTIDE 3 MG PO TABS: 3.0000 mg | ORAL_TABLET | Freq: Every day | ORAL | 2 refills | Status: DC

## 2021-11-08 MED ORDER — QULIPTA 60 MG PO TABS: 60.0000 mg | ORAL_TABLET | Freq: Every day | ORAL | 5 refills | Status: DC

## 2021-11-08 NOTE — Progress Notes (Signed)
[facility] [place], [place] [address]  Internal MEDICINE  Office Visit Note  Patient Name: Barbara Stephens  [id number]  [id number]  Date of Service: 11/08/2021  Chief Complaint  Patient presents with   Follow-up   Hypertension   Weight Loss    HPI Barbara Stephens presents for follow-up visit for weight loss management, hypertension, and migraines.  She was tried on diethylpropion ER at her previous office visit but gained 4 pounds since her previous office visit.  She reports that she did not have any decreased appetite with the medication.  She was provided with samples of Qulipta for migraine prevention due to her Aimovig being too expensive.  She reports that the Qulipta samples helped tremendously in preventing migraines for her.   She would like to try something else to help with weight loss.  She has tried injectable medication in the past but they caused significant constipation that required fecal disimpaction.  She does have an increased risk for developing cardiovascular disease due to her significantly abnormal cholesterol levels and an elevated cholesterol/HDL ratio 5.1.   Current Medication: Outpatient Encounter Medications as of 11/08/2021  Medication Sig   ALPRAZolam (XANAX) 0.25 MG tablet Take 1 tablet (0.25 mg total) by mouth at bedtime as needed for anxiety.   Atogepant (QULIPTA) 60 MG TABS Take 60 mg by mouth daily.   busPIRone (BUSPAR) 10 MG tablet Take 1 tablet (10 mg total) by mouth 2 (two) times daily.   esomeprazole (NEXIUM) 20 MG capsule Take 1 capsule (20 mg total) by mouth daily at 12 noon.   lubiprostone (AMITIZA) 24 MCG capsule Take 1 capsule (24 mcg total) by mouth 2 (two) times daily with a meal.   metoprolol succinate (TOPROL-XL) 25 MG 24 hr tablet TAKE 1 TABLET(25 MG) BY MOUTH DAILY   Semaglutide 3 MG TABS Take 3 mg by mouth daily before breakfast.   Ubrogepant (UBRELVY) 100 MG TABS Take 100 mg by mouth daily as needed (acute migraine).    zolmitriptan (ZOMIG-ZMT) 5 MG disintegrating tablet Take 1 tablet (5 mg total) by mouth as needed for migraine.   [DISCONTINUED] Diethylpropion HCl CR 75 MG TB24 Take 1 tablet (75 mg total) by mouth daily.   [DISCONTINUED] Erenumab-aooe (AIMOVIG) 140 MG/ML SOAJ Inject 140 mg into the skin every 30 (thirty) days.   norethindrone-ethinyl estradiol (LOESTRIN) 1-20 MG-MCG tablet Take 1 tablet by mouth daily after supper.    No facility-administered encounter medications on file as of 11/08/2021.    Surgical History: Past Surgical History:  Procedure Laterality Date   CESAREAN SECTION     COLONOSCOPY WITH PROPOFOL N/A 01/12/2021   Procedure: COLONOSCOPY WITH PROPOFOL;  Surgeon: Jonathon Bellows, MD;  Location: [facility] ENDOSCOPY;  Service: Gastroenterology;  Laterality: N/A;    Medical History: Past Medical History:  Diagnosis Date   Hypertension    Migraines    Premenopausal patient     Family History: Family History  Problem Relation Age of Onset   Thyroid disease Mother    Heart disease Mother    Lung cancer Father    Diabetes Father    Breast cancer Neg Hx     Social History   Socioeconomic History   Marital status: Divorced    Spouse name: Not on file   Number of children: Not on file   Years of education: Not on file   Highest education level: Not on file  Occupational History   Not on file  Tobacco Use  Smoking status: Former    Types: Cigarettes   Smokeless tobacco: Never  Vaping Use   Vaping Use: Former  Substance and Sexual Activity   Alcohol use: Yes    Comment: occasionally   Drug use: Never   Sexual activity: Not on file  Other Topics Concern   Not on file  Social History Narrative   Not on file   Social Determinants of Health   Financial Resource Strain: Not on file  Food Insecurity: Not on file  Transportation Needs: Not on file  Physical Activity: Not on file  Stress: Not on file  Social Connections: Not on file  Intimate Partner Violence: Not on  file      Review of Systems  Constitutional:  Negative for chills, fatigue and unexpected weight change.  HENT:  Negative for congestion, rhinorrhea, sneezing and sore throat.   Eyes:  Negative for redness.  Respiratory:  Negative for cough, chest tightness and shortness of breath.   Cardiovascular:  Negative for chest pain and palpitations.  Gastrointestinal:  Negative for abdominal pain, constipation, diarrhea, nausea and vomiting.  Genitourinary:  Negative for dysuria and frequency.  Musculoskeletal:  Negative for arthralgias, back pain, joint swelling and neck pain.  Skin:  Negative for rash.  Neurological: Negative.  Negative for tremors and numbness.  Hematological:  Negative for adenopathy. Does not bruise/bleed easily.  Psychiatric/Behavioral:  Negative for behavioral problems (Depression), sleep disturbance and suicidal ideas. The patient is not nervous/anxious.     Vital Signs: BP 136/88   Pulse 80   Temp 98.8 F (37.1 C)   Resp 16   Ht '5\' 6"'$  (1.676 m)   Wt 187 lb (84.8 kg)   SpO2 98%   BMI 30.18 kg/m    Physical Exam Vitals reviewed.  Constitutional:      General: She is not in acute distress.    Appearance: Normal appearance. She is obese. She is not ill-appearing.  HENT:     Head: Normocephalic and atraumatic.  Eyes:     Pupils: Pupils are equal, round, and reactive to light.  Cardiovascular:     Rate and Rhythm: Normal rate and regular rhythm.  Pulmonary:     Effort: Pulmonary effort is normal. No respiratory distress.  Neurological:     Mental Status: She is alert and oriented to person, place, and time.  Psychiatric:        Mood and Affect: Mood normal.        Behavior: Behavior normal.        Assessment/Plan: 1. Migraine with aura and without status migrainosus, not intractable The samples of qulipta helped prevent migraines for patient. Prescription sent to pharmacy. Samples provided while she is waiting for her first fill from the  specialty pharmacy. - Atogepant (QULIPTA) 60 MG TABS; Take 60 mg by mouth daily.  Dispense: 30 tablet; Refill: 5  2. Elevated ratio of cholesterol to high density lipoprotein (HDL) Increased risk of CVD, rybelsus prescribed.  - Semaglutide 3 MG TABS; Take 3 mg by mouth daily before breakfast.  Dispense: 30 tablet; Refill: 2  3. Mixed hyperlipidemia Rybelsus prescribed.  - Semaglutide 3 MG TABS; Take 3 mg by mouth daily before breakfast.  Dispense: 30 tablet; Refill: 2  4. Class 1 obesity due to excess calories with serious comorbidity and body mass index (BMI) of 30.0 to 30.9 in adult Will try rybelsus, diethylpropion discontinued - Semaglutide 3 MG TABS; Take 3 mg by mouth daily before breakfast.  Dispense: 30 tablet; Refill:  2  5. At increased risk for cardiovascular disease Rybelsus prescribed.  - Semaglutide 3 MG TABS; Take 3 mg by mouth daily before breakfast.  Dispense: 30 tablet; Refill: 2   General Counseling: Menaal verbalizes understanding of the findings of todays visit and agrees with plan of treatment. I have discussed any further diagnostic evaluation that may be needed or ordered today. We also reviewed her medications today. she has been encouraged to call the office with any questions or concerns that should arise related to todays visit.    No orders of the defined types were placed in this encounter.   Meds ordered this encounter  Medications   Semaglutide 3 MG TABS    Sig: Take 3 mg by mouth daily before breakfast.    Dispense:  30 tablet    Refill:  2    DX codes: Z91.89, E78.6, E78.2, E66.09, E66.01   Atogepant (QULIPTA) 60 MG TABS    Sig: Take 60 mg by mouth daily.    Dispense:  30 tablet    Refill:  5    Return for F/U, Weight loss, eval new med, Patrece Tallie PCP in 2-3 months.   Total time spent:30 Minutes Time spent includes review of chart, medications, test results, and follow up plan with the patient.   [place] Controlled Substance Database was reviewed  by me.  This patient was seen by Jonetta Osgood, FNP-C in collaboration with Dr. Clayborn Bigness as a part of collaborative care agreement.   Virgina Deakins R. Valetta Fuller, MSN, FNP-C Internal medicine

## 2021-11-12 ENCOUNTER — Telehealth: Payer: Self-pay

## 2021-11-12 NOTE — Telephone Encounter (Signed)
PA for [facility] sent 11/12/21 @ 10:19 pm

## 2021-11-13 ENCOUNTER — Telehealth: Payer: Self-pay

## 2021-11-17 NOTE — Telephone Encounter (Signed)
error 

## 2021-12-14 ENCOUNTER — Encounter: Payer: Managed Care, Other (non HMO) | Admitting: Nurse Practitioner

## 2022-01-02 ENCOUNTER — Encounter: Payer: Self-pay | Admitting: Nurse Practitioner

## 2022-01-02 ENCOUNTER — Ambulatory Visit (INDEPENDENT_AMBULATORY_CARE_PROVIDER_SITE_OTHER): Payer: Managed Care, Other (non HMO) | Admitting: Nurse Practitioner

## 2022-01-02 VITALS — BP 130/75 | HR 85 | Temp 98.1°F | Resp 16 | Ht 66.0 in | Wt 183.2 lb

## 2022-01-02 DIAGNOSIS — G43109 Migraine with aura, not intractable, without status migrainosus: Secondary | ICD-10-CM

## 2022-01-02 DIAGNOSIS — I10 Essential (primary) hypertension: Secondary | ICD-10-CM

## 2022-01-02 DIAGNOSIS — Z1231 Encounter for screening mammogram for malignant neoplasm of breast: Secondary | ICD-10-CM

## 2022-01-02 DIAGNOSIS — R300 Dysuria: Secondary | ICD-10-CM | POA: Diagnosis not present

## 2022-01-02 DIAGNOSIS — Z0001 Encounter for general adult medical examination with abnormal findings: Secondary | ICD-10-CM

## 2022-01-02 MED ORDER — METOPROLOL SUCCINATE ER 25 MG PO TB24: ORAL_TABLET | ORAL | 1 refills | Status: DC

## 2022-01-02 MED ORDER — ZOLMITRIPTAN 5 MG PO TBDP: 5.0000 mg | ORAL_TABLET | ORAL | 2 refills | Status: DC | PRN

## 2022-01-02 NOTE — Progress Notes (Signed)
[facility] [place], [place] [address]  Internal MEDICINE  Office Visit Note  Patient Name: Barbara Stephens  [id number]  [id number]  Date of Service: 01/02/2022  Chief Complaint  Patient presents with   Annual Exam   Hypertension    HPI [name] presents for an annual well visit and physical exam. Well-appearing 46 year old female with hypertension, migraines, pituitary adenoma, GAD.  --due for routine mammogram --due for refills Labs as follows: LDL 149; Total chol 221; HDL 56; Tri 69; Ratio 3.9 A1c 5.2; Creatinine 0.62; eGFR 111 Recheck BP, improved, see vitals Having maybe 1 migraine per month niow --lost 4 lbs   Current Medication: Outpatient Encounter Medications as of 01/02/2022  Medication Sig   ALPRAZolam (XANAX) 0.25 MG tablet Take 1 tablet (0.25 mg total) by mouth at bedtime as needed for anxiety.   Atogepant (QULIPTA) 60 MG TABS Take 60 mg by mouth daily.   esomeprazole (NEXIUM) 20 MG capsule Take 1 capsule (20 mg total) by mouth daily at 12 noon.   metoprolol succinate (TOPROL-XL) 25 MG 24 hr tablet TAKE 1 TABLET(25 MG) BY MOUTH DAILY   Ubrogepant (UBRELVY) 100 MG TABS Take 100 mg by mouth daily as needed (acute migraine).   zolmitriptan (ZOMIG-ZMT) 5 MG disintegrating tablet Take 1 tablet (5 mg total) by mouth as needed for migraine.   [DISCONTINUED] busPIRone (BUSPAR) 10 MG tablet Take 1 tablet (10 mg total) by mouth 2 (two) times daily.   [DISCONTINUED] lubiprostone (AMITIZA) 24 MCG capsule Take 1 capsule (24 mcg total) by mouth 2 (two) times daily with a meal.   [DISCONTINUED] metoprolol succinate (TOPROL-XL) 25 MG 24 hr tablet TAKE 1 TABLET(25 MG) BY MOUTH DAILY   [DISCONTINUED] norethindrone-ethinyl estradiol (LOESTRIN) 1-20 MG-MCG tablet Take 1 tablet by mouth daily after supper.    [DISCONTINUED] Semaglutide 3 MG TABS Take 3 mg by mouth daily before breakfast.   [DISCONTINUED] zolmitriptan (ZOMIG-ZMT) 5 MG disintegrating tablet Take  1 tablet (5 mg total) by mouth as needed for migraine.   No facility-administered encounter medications on file as of 01/02/2022.    Surgical History: Past Surgical History:  Procedure Laterality Date   CESAREAN SECTION     COLONOSCOPY WITH PROPOFOL N/A 01/12/2021   Procedure: COLONOSCOPY WITH PROPOFOL;  Surgeon: Jonathon Bellows, MD;  Location: [facility] ENDOSCOPY;  Service: Gastroenterology;  Laterality: N/A;    Medical History: Past Medical History:  Diagnosis Date   Hypertension    Migraines    Premenopausal patient     Family History: Family History  Problem Relation Age of Onset   Thyroid disease Mother    Heart disease Mother    Lung cancer Father    Diabetes Father    Breast cancer Neg Hx     Social History   Socioeconomic History   Marital status: Divorced    Spouse name: Not on file   Number of children: Not on file   Years of education: Not on file   Highest education level: Not on file  Occupational History   Not on file  Tobacco Use   Smoking status: Former    Types: Cigarettes   Smokeless tobacco: Never  Vaping Use   Vaping Use: Former  Substance and Sexual Activity   Alcohol use: Yes    Comment: occasionally   Drug use: Never   Sexual activity: Not on file  Other Topics Concern   Not on file  Social History Narrative   Not on file  Social Determinants of Health   Financial Resource Strain: Not on file  Food Insecurity: Not on file  Transportation Needs: Not on file  Physical Activity: Not on file  Stress: Not on file  Social Connections: Not on file  Intimate Partner Violence: Not on file      Review of Systems  Constitutional:  Negative for activity change, appetite change, chills, fatigue, fever and unexpected weight change.  HENT: Negative.  Negative for congestion, ear pain, rhinorrhea, sore throat and trouble swallowing.        Sensitivity to sound associated with migraines  Eyes:  Positive for photophobia (associated with migraines).   Respiratory: Negative.  Negative for cough, chest tightness, shortness of breath and wheezing.   Cardiovascular: Negative.  Negative for chest pain.  Gastrointestinal:  Positive for nausea (associated with migraines). Negative for abdominal pain, blood in stool, constipation, diarrhea and vomiting.  Endocrine: Negative.   Genitourinary: Negative.  Negative for difficulty urinating, dysuria, frequency, hematuria and urgency.  Musculoskeletal: Negative.  Negative for arthralgias, back pain, joint swelling, myalgias and neck pain.  Skin: Negative.  Negative for rash and wound.  Allergic/Immunologic: Negative.  Negative for immunocompromised state.  Neurological:  Positive for dizziness (associated with migraines) and headaches. Negative for seizures and numbness.  Hematological: Negative.   Psychiatric/Behavioral: Negative.  Negative for behavioral problems, self-injury and suicidal ideas. The patient is not nervous/anxious.     Vital Signs: BP 130/75   Pulse 85   Temp 98.1 F (36.7 C)   Resp 16   Ht 5' 6"  (1.676 m)   Wt 183 lb 3.2 oz (83.1 kg)   SpO2 98%   BMI 29.57 kg/m    Physical Exam Vitals reviewed.  Constitutional:      General: She is awake. She is not in acute distress.    Appearance: Normal appearance. She is well-developed and well-groomed. She is obese. She is not ill-appearing or diaphoretic.  HENT:     Head: Normocephalic and atraumatic.     Right Ear: Tympanic membrane, ear canal and external ear normal.     Left Ear: Tympanic membrane, ear canal and external ear normal.     Nose: Nose normal. No congestion or rhinorrhea.     Mouth/Throat:     Lips: Pink.     Mouth: Mucous membranes are moist.     Pharynx: Oropharynx is clear. Uvula midline. No oropharyngeal exudate or posterior oropharyngeal erythema.  Eyes:     General: Lids are normal. Vision grossly intact. Gaze aligned appropriately. No scleral icterus.       Right eye: No discharge.        Left eye: No  discharge.     Extraocular Movements: Extraocular movements intact.     Conjunctiva/sclera: Conjunctivae normal.     Pupils: Pupils are equal, round, and reactive to light.     Funduscopic exam:    Right eye: Red reflex present.        Left eye: Red reflex present. Neck:     Thyroid: No thyromegaly.     Vascular: No JVD.     Trachea: Trachea and phonation normal. No tracheal deviation.  Cardiovascular:     Rate and Rhythm: Normal rate and regular rhythm.     Pulses: Normal pulses.     Heart sounds: Normal heart sounds, S1 normal and S2 normal. No murmur heard.    No friction rub. No gallop.  Pulmonary:     Effort: Pulmonary effort is normal. No accessory muscle  usage or respiratory distress.     Breath sounds: Normal breath sounds and air entry. No stridor. No wheezing or rales.  Chest:     Chest wall: No tenderness.  Breasts:    Breasts are symmetrical.     Right: Normal. No swelling, bleeding, inverted nipple, mass, nipple discharge, skin change or tenderness.     Left: No swelling, bleeding, inverted nipple, mass, nipple discharge, skin change or tenderness.  Abdominal:     General: Bowel sounds are normal. There is no distension.     Palpations: Abdomen is soft. There is no shifting dullness, fluid wave, mass or pulsatile mass.     Tenderness: There is no abdominal tenderness. There is no guarding or rebound.  Musculoskeletal:        General: No tenderness or deformity. Normal range of motion.     Cervical back: Normal range of motion and neck supple.     Right lower leg: No edema.     Left lower leg: No edema.  Lymphadenopathy:     Cervical: No cervical adenopathy.     Upper Body:     Right upper body: No supraclavicular, axillary or pectoral adenopathy.     Left upper body: No supraclavicular, axillary or pectoral adenopathy.  Skin:    General: Skin is warm and dry.     Capillary Refill: Capillary refill takes less than 2 seconds.     Coloration: Skin is not pale.      Findings: No erythema or rash.  Neurological:     Mental Status: She is alert and oriented to person, place, and time.     Cranial Nerves: No cranial nerve deficit.     Motor: No abnormal muscle tone.     Coordination: Coordination normal.     Gait: Gait normal.     Deep Tendon Reflexes: Reflexes are normal and symmetric.  Psychiatric:        Mood and Affect: Mood and affect normal.        Behavior: Behavior normal. Behavior is cooperative.        Thought Content: Thought content normal.        Judgment: Judgment normal.        Assessment/Plan: 1. Encounter for general adult medical examination with abnormal findings Age-appropriate preventive screenings and vaccinations discussed, annual physical exam completed. Routine labs for health maintenance drawn prior to office visit, results discussed today. PHM updated.   2. Essential hypertension Stable, continue medications as prescribed - metoprolol succinate (TOPROL-XL) 25 MG 24 hr tablet; TAKE 1 TABLET(25 MG) BY MOUTH DAILY  Dispense: 90 tablet; Refill: 1  3. Migraine with aura and without status migrainosus, not intractable Improved with current treatment, zolmitriptan refills ordered. Continue medications as prescribed - zolmitriptan (ZOMIG-ZMT) 5 MG disintegrating tablet; Take 1 tablet (5 mg total) by mouth as needed for migraine.  Dispense: 10 tablet; Refill: 2  4. Dysuria Routine urinalysis done - UA/M w/rflx Culture, Routine - Microscopic Examination - Urine Culture, Reflex  5. Encounter for screening mammogram for malignant neoplasm of breast Routine mammogram ordered - MM 3D SCREEN BREAST BILATERAL; Future\      General Counseling: Jaydyn verbalizes understanding of the findings of todays visit and agrees with plan of treatment. I have discussed any further diagnostic evaluation that may be needed or ordered today. We also reviewed her medications today. she has been encouraged to call the office with any questions  or concerns that should arise related to todays visit.  Orders Placed This Encounter  Procedures   MM 3D SCREEN BREAST BILATERAL   UA/M w/rflx Culture, Routine    Meds ordered this encounter  Medications   metoprolol succinate (TOPROL-XL) 25 MG 24 hr tablet    Sig: TAKE 1 TABLET(25 MG) BY MOUTH DAILY    Dispense:  90 tablet    Refill:  1   zolmitriptan (ZOMIG-ZMT) 5 MG disintegrating tablet    Sig: Take 1 tablet (5 mg total) by mouth as needed for migraine.    Dispense:  10 tablet    Refill:  2    Return in about 6 months (around 07/03/2022) for F/U, med refill, Samrat Hayward PCP.   Total time spent:30 Minutes Time spent includes review of chart, medications, test results, and follow up plan with the patient.    Controlled Substance Database was reviewed by me.  This patient was seen by Jonetta Osgood, FNP-C in collaboration with Dr. Clayborn Bigness as a part of collaborative care agreement.  Brieanne Mignone R. Valetta Fuller, MSN, FNP-C Internal medicine

## 2022-01-06 LAB — UA/M W/RFLX CULTURE, ROUTINE
Bilirubin, UA: NEGATIVE
Glucose, UA: NEGATIVE
Ketones, UA: NEGATIVE
Nitrite, UA: NEGATIVE
Protein,UA: NEGATIVE
RBC, UA: NEGATIVE
Specific Gravity, UA: 1.008 (ref 1.005–1.030)
Urobilinogen, Ur: 0.2 mg/dL (ref 0.2–1.0)
pH, UA: 6.5 (ref 5.0–7.5)

## 2022-01-06 LAB — MICROSCOPIC EXAMINATION
Bacteria, UA: NONE SEEN
Casts: NONE SEEN /lpf
WBC, UA: NONE SEEN /hpf (ref 0–5)

## 2022-01-06 LAB — URINE CULTURE, REFLEX

## 2022-01-10 ENCOUNTER — Ambulatory Visit: Payer: Managed Care, Other (non HMO) | Admitting: Nurse Practitioner

## 2022-02-17 ENCOUNTER — Encounter: Payer: Self-pay | Admitting: Nurse Practitioner

## 2022-04-18 ENCOUNTER — Ambulatory Visit
Admission: EM | Admit: 2022-04-18 | Discharge: 2022-04-18 | Disposition: A | Discharge status: Home / Self Care | Payer: Managed Care, Other (non HMO)

## 2022-04-18 DIAGNOSIS — B9789 Other viral agents as the cause of diseases classified elsewhere: Secondary | ICD-10-CM

## 2022-04-18 DIAGNOSIS — H6992 Unspecified Eustachian tube disorder, left ear: Secondary | ICD-10-CM | POA: Diagnosis not present

## 2022-04-18 DIAGNOSIS — J019 Acute sinusitis, unspecified: Secondary | ICD-10-CM | POA: Diagnosis not present

## 2022-04-18 NOTE — ED Triage Notes (Signed)
Pt. Presents to UC w/ c/o left ear pain and fullness since yesterday.

## 2022-04-18 NOTE — ED Provider Notes (Signed)
Roderic Palau    CSN: [id number] Arrival date & time: 04/18/22  1047      History   Chief Complaint Chief Complaint  Patient presents with   Otalgia   Ear Fullness    HPI Barbara Stephens is a 47 y.o. female.    Otalgia Ear Fullness    Patient presents to urgent care with complaint of left ear pain and fullness starting yesterday.  She endorses sinus and nasal congestion.  She states her symptoms are somewhat relieved by use of Mucinex as well as nasal decongestant.  Past Medical History:  Diagnosis Date   Hypertension    Migraines    Premenopausal patient     Patient Active Problem List   Diagnosis Date Noted   Intractable migraine with aura with status migrainosus 02/28/2020   Acute non-recurrent frontal sinusitis 01/18/2019   Essential hypertension 12/24/2018   Generalized anxiety disorder 12/24/2018   Atopic dermatitis 06/12/2018   Acute upper respiratory infection 05/27/2018   Cough 05/27/2018   Moderate obesity 12/05/2017   Chronic migraine with aura 11/14/2017   Pituitary adenoma ([place]) 04/30/2017    Past Surgical History:  Procedure Laterality Date   CESAREAN SECTION     COLONOSCOPY WITH PROPOFOL N/A 01/12/2021   Procedure: COLONOSCOPY WITH PROPOFOL;  Surgeon: Jonathon Bellows, MD;  Location: [facility] ENDOSCOPY;  Service: Gastroenterology;  Laterality: N/A;    OB History   No obstetric history on file.      Home Medications    Prior to Admission medications   Medication Sig Start Date End Date Taking? Authorizing Provider  ALPRAZolam (XANAX) 0.25 MG tablet Take 1 tablet (0.25 mg total) by mouth at bedtime as needed for anxiety. 11/24/20   Jonetta Osgood, NP  Atogepant (QULIPTA) 60 MG TABS Take 60 mg by mouth daily. 11/08/21   Jonetta Osgood, NP  esomeprazole (NEXIUM) 20 MG capsule Take 1 capsule (20 mg total) by mouth daily at 12 noon. 05/29/21   Jonetta Osgood, NP  metoprolol succinate (TOPROL-XL) 25 MG 24 hr tablet TAKE 1 TABLET(25 MG)  BY MOUTH DAILY 01/02/22   Abernathy, Yetta Flock, NP  Ubrogepant (UBRELVY) 100 MG TABS Take 100 mg by mouth daily as needed (acute migraine). 06/11/21   Jonetta Osgood, NP  zolmitriptan (ZOMIG-ZMT) 5 MG disintegrating tablet Take 1 tablet (5 mg total) by mouth as needed for migraine. 01/02/22   Jonetta Osgood, NP    Family History Family History  Problem Relation Age of Onset   Thyroid disease Mother    Heart disease Mother    Lung cancer Father    Diabetes Father    Breast cancer Neg Hx     Social History Social History   Tobacco Use   Smoking status: Former    Types: Cigarettes   Smokeless tobacco: Never  Vaping Use   Vaping Use: Former  Substance Use Topics   Alcohol use: Yes    Comment: occasionally   Drug use: Never     Allergies   Yellow jacket venom [bee venom]   Review of Systems Review of Systems  HENT:  Positive for ear pain.      Physical Exam Triage Vital Signs ED Triage Vitals  Enc Vitals Group     BP 04/18/22 1057 139/84     Pulse Rate 04/18/22 1057 84     Resp 04/18/22 1057 17     Temp 04/18/22 1057 98.8 F (37.1 C)     Temp src --      SpO2  04/18/22 1057 96 %     Weight --      Height --      Head Circumference --      Peak Flow --      Pain Score 04/18/22 1058 0     Pain Loc --      Pain Edu? --      Excl. in [place]? --    No data found.  Updated Vital Signs BP 139/84   Pulse 84   Temp 98.8 F (37.1 C)   Resp 17   LMP  (LMP Unknown)   SpO2 96%   Visual Acuity Right Eye Distance:   Left Eye Distance:   Bilateral Distance:    Right Eye Near:   Left Eye Near:    Bilateral Near:     Physical Exam Vitals reviewed.  Constitutional:      Appearance: Normal appearance.  HENT:     Left Ear: Tympanic membrane normal.     Ears:     Comments: Left EAC is somewhat tender during exam and edematous .  TM is WNL. Skin:    General: Skin is warm and dry.  Neurological:     General: No focal deficit present.     Mental Status: She is  alert and oriented to person, place, and time.  Psychiatric:        Mood and Affect: Mood normal.        Behavior: Behavior normal.      UC Treatments / Results  Labs (all labs ordered are listed, but only abnormal results are displayed) Labs Reviewed - No data to display  EKG   Radiology No results found.  Procedures Procedures (including critical care time)  Medications Ordered in UC Medications - No data to display  Initial Impression / Assessment and Plan / UC Course  I have reviewed the triage vital signs and the nursing notes.  Pertinent labs & imaging results that were available during my care of the patient were reviewed by me and considered in my medical decision making (see chart for details).   Patient is afebrile here without recent antipyretics. Satting well on room air. Overall is well appearing, well hydrated, without respiratory distress. Pulmonary exam is unremarkable.  Left TM is WNL however left EAC is somewhat edematous and tender during otoscopic exam.  Suspect acute viral process causing sinusitis resulting in the left eustachian tube dysfunction.  Discussed possible treatment plans ranging from conservative: Continued use of oral decongestant as well as topical, and, if symptoms are severe, treatment with anti-inflammatory corticosteroid.  Suggested Sudafed (pseudoephedrine).  Final Clinical Impressions(s) / UC Diagnoses   Final diagnoses:  None   Discharge Instructions   None    ED Prescriptions   None    PDMP not reviewed this encounter.   Rose Phi, [place] 04/18/22 1118

## 2022-04-18 NOTE — Discharge Instructions (Signed)
Follow up here or with your primary care provider if your symptoms are worsening or not improving.     

## 2022-04-23 ENCOUNTER — Ambulatory Visit (INDEPENDENT_AMBULATORY_CARE_PROVIDER_SITE_OTHER): Payer: Managed Care, Other (non HMO) | Admitting: Nurse Practitioner

## 2022-04-23 ENCOUNTER — Encounter: Payer: Self-pay | Admitting: Nurse Practitioner

## 2022-04-23 VITALS — BP 124/80 | HR 82 | Temp 97.8°F | Resp 16 | Ht 65.0 in | Wt 198.0 lb

## 2022-04-23 DIAGNOSIS — I10 Essential (primary) hypertension: Secondary | ICD-10-CM | POA: Diagnosis not present

## 2022-04-23 DIAGNOSIS — Z6832 Body mass index (BMI) 32.0-32.9, adult: Secondary | ICD-10-CM

## 2022-04-23 DIAGNOSIS — G43109 Migraine with aura, not intractable, without status migrainosus: Secondary | ICD-10-CM | POA: Diagnosis not present

## 2022-04-23 DIAGNOSIS — E6609 Other obesity due to excess calories: Secondary | ICD-10-CM | POA: Diagnosis not present

## 2022-04-23 MED ORDER — QULIPTA 60 MG PO TABS: 60.0000 mg | ORAL_TABLET | Freq: Every day | ORAL | 5 refills | Status: DC

## 2022-04-23 MED ORDER — ZOLMITRIPTAN 5 MG PO TBDP: 5.0000 mg | ORAL_TABLET | ORAL | 2 refills | Status: DC | PRN

## 2022-04-23 MED ORDER — TIRZEPATIDE-WEIGHT MANAGEMENT 5 MG/0.5ML ~~LOC~~ SOAJ: 5.0000 mg | SUBCUTANEOUS | 3 refills | Status: DC

## 2022-04-23 MED ORDER — UBRELVY 100 MG PO TABS: 100.0000 mg | ORAL_TABLET | Freq: Every day | ORAL | 2 refills | Status: DC | PRN

## 2022-04-23 MED ORDER — METOPROLOL SUCCINATE ER 25 MG PO TB24: ORAL_TABLET | ORAL | 1 refills | Status: DC

## 2022-04-23 NOTE — Progress Notes (Signed)
[facility] [place],  [address]  Internal MEDICINE  Office Visit Note  Patient Name: Barbara Stephens  [id number]  [id number]  Date of Service: 04/23/2022  Chief Complaint  Patient presents with   Medical Management of Chronic Issues    Weight management    HPI Blake presents for a follow-up visit for migraines and weight loss.  Migraines -- Lenoria Chime is working well for prevention. due for refills of ubrelvy and zomig  Weight loss management -- checked with insurance, zepbound is covered with [place], BMI is 32.95. mounjaro sample worked for patient and wants to continue the medication.  Hypertension -- controlled with current medication, takes metoprolol succinate    Current Medication: Outpatient Encounter Medications as of 04/23/2022  Medication Sig   ALPRAZolam (XANAX) 0.25 MG tablet Take 1 tablet (0.25 mg total) by mouth at bedtime as needed for anxiety.   tirzepatide (ZEPBOUND) 5 MG/0.5ML Pen Inject 5 mg into the skin once a week.   [DISCONTINUED] Atogepant (QULIPTA) 60 MG TABS Take 60 mg by mouth daily.   [DISCONTINUED] esomeprazole (NEXIUM) 20 MG capsule Take 1 capsule (20 mg total) by mouth daily at 12 noon.   [DISCONTINUED] metoprolol succinate (TOPROL-XL) 25 MG 24 hr tablet TAKE 1 TABLET(25 MG) BY MOUTH DAILY   [DISCONTINUED] Ubrogepant (UBRELVY) 100 MG TABS Take 100 mg by mouth daily as needed (acute migraine).   [DISCONTINUED] zolmitriptan (ZOMIG-ZMT) 5 MG disintegrating tablet Take 1 tablet (5 mg total) by mouth as needed for migraine.   Atogepant (QULIPTA) 60 MG TABS Take 60 mg by mouth daily.   metoprolol succinate (TOPROL-XL) 25 MG 24 hr tablet TAKE 1 TABLET(25 MG) BY MOUTH DAILY   Ubrogepant (UBRELVY) 100 MG TABS Take 100 mg by mouth daily as needed (acute migraine).   zolmitriptan (ZOMIG-ZMT) 5 MG disintegrating tablet Take 1 tablet (5 mg total) by mouth as needed for migraine.   No facility-administered encounter medications on file  as of 04/23/2022.    Surgical History: Past Surgical History:  Procedure Laterality Date   CESAREAN SECTION     COLONOSCOPY WITH PROPOFOL N/A 01/12/2021   Procedure: COLONOSCOPY WITH PROPOFOL;  Surgeon: Jonathon Bellows, MD;  Location: [facility] ENDOSCOPY;  Service: Gastroenterology;  Laterality: N/A;    Medical History: Past Medical History:  Diagnosis Date   Hypertension    Migraines    Premenopausal patient     Family History: Family History  Problem Relation Age of Onset   Thyroid disease Mother    Heart disease Mother    Lung cancer Father    Diabetes Father    Breast cancer Neg Hx     Social History   Socioeconomic History   Marital status: Divorced    Spouse name: Not on file   Number of children: Not on file   Years of education: Not on file   Highest education level: Not on file  Occupational History   Not on file  Tobacco Use   Smoking status: Former    Types: Cigarettes   Smokeless tobacco: Never  Vaping Use   Vaping Use: Former  Substance and Sexual Activity   Alcohol use: Yes    Comment: occasionally   Drug use: Never   Sexual activity: Not on file  Other Topics Concern   Not on file  Social History Narrative   Not on file   Social Determinants of Health   Financial Resource Strain: Not on file  Food Insecurity: Not on file  Transportation  Needs: Not on file  Physical Activity: Not on file  Stress: Not on file  Social Connections: Not on file  Intimate Partner Violence: Not on file      Review of Systems  Constitutional:  Negative for activity change, appetite change, chills, fatigue, fever and unexpected weight change.  HENT: Negative.  Negative for congestion, ear pain, rhinorrhea, sore throat and trouble swallowing.        Sensitivity to sound associated with migraines  Eyes:  Positive for photophobia (associated with migraines).  Respiratory: Negative.  Negative for cough, chest tightness, shortness of breath and wheezing.    Cardiovascular: Negative.  Negative for chest pain.  Gastrointestinal:  Positive for nausea (associated with migraines). Negative for abdominal pain, blood in stool, constipation, diarrhea and vomiting.  Endocrine: Negative.   Genitourinary: Negative.  Negative for difficulty urinating, dysuria, frequency, hematuria and urgency.  Musculoskeletal: Negative.  Negative for arthralgias, back pain, joint swelling, myalgias and neck pain.  Skin: Negative.  Negative for rash and wound.  Allergic/Immunologic: Negative.  Negative for immunocompromised state.  Neurological:  Positive for dizziness (associated with migraines) and headaches. Negative for seizures and numbness.  Hematological: Negative.   Psychiatric/Behavioral: Negative.  Negative for behavioral problems, self-injury and suicidal ideas. The patient is not nervous/anxious.     Vital Signs: BP 124/80   Pulse 82   Temp 97.8 F (36.6 C)   Resp 16   Ht '5\' 5"'$  (1.651 m)   Wt 198 lb (89.8 kg)   LMP  (LMP Unknown)   SpO2 96%   BMI 32.95 kg/m    Physical Exam Vitals reviewed.  Constitutional:      General: She is not in acute distress.    Appearance: Normal appearance. She is obese. She is not ill-appearing.  HENT:     Head: Normocephalic and atraumatic.  Eyes:     Pupils: Pupils are equal, round, and reactive to light.  Cardiovascular:     Rate and Rhythm: Normal rate and regular rhythm.  Pulmonary:     Effort: Pulmonary effort is normal. No respiratory distress.  Neurological:     Mental Status: She is alert and oriented to person, place, and time.  Psychiatric:        Mood and Affect: Mood normal.        Behavior: Behavior normal.        Assessment/Plan: 1. Migraine with aura and without status migrainosus, not intractable Continue medications as prescribed. Refills ordered.  - Ubrogepant (UBRELVY) 100 MG TABS; Take 100 mg by mouth daily as needed (acute migraine).  Dispense: 16 tablet; Refill: 2 - Atogepant  (QULIPTA) 60 MG TABS; Take 60 mg by mouth daily.  Dispense: 30 tablet; Refill: 5 - zolmitriptan (ZOMIG-ZMT) 5 MG disintegrating tablet; Take 1 tablet (5 mg total) by mouth as needed for migraine.  Dispense: 10 tablet; Refill: 2  2. Essential hypertension Stable, continue metoprolol as prescribed.  - metoprolol succinate (TOPROL-XL) 25 MG 24 hr tablet; TAKE 1 TABLET(25 MG) BY MOUTH DAILY  Dispense: 90 tablet; Refill: 1  3. Class 1 obesity due to excess calories with serious comorbidity and body mass index (BMI) of 32.0 to 32.9 in adult Zepbound prescribed as requested by patient, insurance told her that they will cover the medication with a prior authorization. Prescription sent. Follow up in 4 weeks.  - tirzepatide (ZEPBOUND) 5 MG/0.5ML Pen; Inject 5 mg into the skin once a week.  Dispense: 2 mL; Refill: 3   General Counseling: Grier verbalizes  understanding of the findings of todays visit and agrees with plan of treatment. I have discussed any further diagnostic evaluation that may be needed or ordered today. We also reviewed her medications today. she has been encouraged to call the office with any questions or concerns that should arise related to todays visit.    No orders of the defined types were placed in this encounter.   Meds ordered this encounter  Medications   tirzepatide (ZEPBOUND) 5 MG/0.5ML Pen    Sig: Inject 5 mg into the skin once a week.    Dispense:  2 mL    Refill:  3    Insurance does cover but will require prior authorization, please send to [place] asap, fax # [phone]. Dx code E66.01   Ubrogepant (UBRELVY) 100 MG TABS    Sig: Take 100 mg by mouth daily as needed (acute migraine).    Dispense:  16 tablet    Refill:  2   Atogepant (QULIPTA) 60 MG TABS    Sig: Take 60 mg by mouth daily.    Dispense:  30 tablet    Refill:  5   zolmitriptan (ZOMIG-ZMT) 5 MG disintegrating tablet    Sig: Take 1 tablet (5 mg total) by mouth as needed for migraine.    Dispense:  10  tablet    Refill:  2   metoprolol succinate (TOPROL-XL) 25 MG 24 hr tablet    Sig: TAKE 1 TABLET(25 MG) BY MOUTH DAILY    Dispense:  90 tablet    Refill:  1    Return in about 4 weeks (around 05/21/2022) for F/U, Weight loss, Ciaran Begay PCP.   Total time spent:30 Minutes Time spent includes review of chart, medications, test results, and follow up plan with the patient.   [place] Controlled Substance Database was reviewed by me.  This patient was seen by Jonetta Osgood, FNP-C in collaboration with Dr. Clayborn Bigness as a part of collaborative care agreement.   Terrelle Ruffolo R. Valetta Fuller, MSN, FNP-C Internal medicine

## 2022-04-26 ENCOUNTER — Ambulatory Visit: Payer: Managed Care, Other (non HMO) | Admitting: Nurse Practitioner

## 2022-04-27 ENCOUNTER — Ambulatory Visit: Payer: Managed Care, Other (non HMO) | Admitting: Nurse Practitioner

## 2022-05-10 ENCOUNTER — Telehealth: Payer: Self-pay

## 2022-05-10 ENCOUNTER — Encounter: Payer: Self-pay | Admitting: Nurse Practitioner

## 2022-05-10 NOTE — Telephone Encounter (Signed)
Submitted paperwork via fax for patient's Zepbound.

## 2022-05-18 ENCOUNTER — Ambulatory Visit
Admission: RE | Admit: 2022-05-18 | Discharge: 2022-05-18 | Disposition: A | Discharge status: Home / Self Care | Payer: Managed Care, Other (non HMO) | Source: Ambulatory Visit | Attending: Nurse Practitioner | Admitting: Nurse Practitioner

## 2022-05-18 DIAGNOSIS — Z1231 Encounter for screening mammogram for malignant neoplasm of breast: Secondary | ICD-10-CM | POA: Diagnosis not present

## 2022-05-21 ENCOUNTER — Ambulatory Visit: Payer: Managed Care, Other (non HMO) | Admitting: Nurse Practitioner

## 2022-05-22 ENCOUNTER — Other Ambulatory Visit: Payer: Self-pay | Admitting: Nurse Practitioner

## 2022-05-22 DIAGNOSIS — E6609 Other obesity due to excess calories: Secondary | ICD-10-CM

## 2022-05-22 MED ORDER — NALTREXONE-BUPROPION HCL ER 8-90 MG PO TB12: 1.0000 | ORAL_TABLET | Freq: Two times a day (BID) | ORAL | 2 refills | Status: DC

## 2022-05-31 ENCOUNTER — Encounter: Payer: Self-pay | Admitting: Nurse Practitioner

## 2022-05-31 ENCOUNTER — Ambulatory Visit (INDEPENDENT_AMBULATORY_CARE_PROVIDER_SITE_OTHER): Payer: Managed Care, Other (non HMO) | Admitting: Nurse Practitioner

## 2022-05-31 VITALS — BP 125/80 | HR 85 | Temp 98.2°F | Resp 16 | Ht 65.0 in | Wt 198.4 lb

## 2022-05-31 DIAGNOSIS — E6609 Other obesity due to excess calories: Secondary | ICD-10-CM | POA: Diagnosis not present

## 2022-05-31 DIAGNOSIS — J0110 Acute frontal sinusitis, unspecified: Secondary | ICD-10-CM

## 2022-05-31 DIAGNOSIS — G43109 Migraine with aura, not intractable, without status migrainosus: Secondary | ICD-10-CM | POA: Diagnosis not present

## 2022-05-31 DIAGNOSIS — I10 Essential (primary) hypertension: Secondary | ICD-10-CM | POA: Diagnosis not present

## 2022-05-31 DIAGNOSIS — Z6830 Body mass index (BMI) 30.0-30.9, adult: Secondary | ICD-10-CM

## 2022-05-31 MED ORDER — AZITHROMYCIN 250 MG PO TABS: ORAL_TABLET | ORAL | 0 refills | Status: AC

## 2022-05-31 NOTE — Progress Notes (Signed)
[facility] [place], [place] [address]  Internal MEDICINE  Office Visit Note  Patient Name: Barbara Stephens  [id number]  [id number]  Date of Service: 05/31/2022  Chief Complaint  Patient presents with   Follow-up    Weight loss      HPI Barbara Stephens presents for a follow-up visit for migraine, weight loss, sinus infection Getting ready to start contrave this week Migraine are still controled BP elevated, improved when rechecked Sinus infection - started 11 days ago, sinus pressure sinus green drainage. , cough, nasal congestion, runny nose, sore throat, fatigue, headache.     Current Medication: Outpatient Encounter Medications as of 05/31/2022  Medication Sig   ALPRAZolam (XANAX) 0.25 MG tablet Take 1 tablet (0.25 mg total) by mouth at bedtime as needed for anxiety.   Atogepant (QULIPTA) 60 MG TABS Take 60 mg by mouth daily.   [EXPIRED] azithromycin (ZITHROMAX) 250 MG tablet Take 2 tablets on day 1, then 1 tablet daily on days 2 through 5   metoprolol succinate (TOPROL-XL) 25 MG 24 hr tablet TAKE 1 TABLET(25 MG) BY MOUTH DAILY   Naltrexone-buPROPion HCl ER 8-90 MG TB12 Take 1 tablet by mouth in the morning and at bedtime.   Ubrogepant (UBRELVY) 100 MG TABS Take 100 mg by mouth daily as needed (acute migraine).   zolmitriptan (ZOMIG-ZMT) 5 MG disintegrating tablet Take 1 tablet (5 mg total) by mouth as needed for migraine.   No facility-administered encounter medications on file as of 05/31/2022.    Surgical History: Past Surgical History:  Procedure Laterality Date   CESAREAN SECTION     COLONOSCOPY WITH PROPOFOL N/A 01/12/2021   Procedure: COLONOSCOPY WITH PROPOFOL;  Surgeon: Jonathon Bellows, MD;  Location: [facility] ENDOSCOPY;  Service: Gastroenterology;  Laterality: N/A;    Medical History: Past Medical History:  Diagnosis Date   Hypertension    Migraines    Premenopausal patient     Family History: Family History  Problem Relation Age of Onset    Thyroid disease Mother    Heart disease Mother    Lung cancer Father    Diabetes Father    Breast cancer Neg Hx     Social History   Socioeconomic History   Marital status: Divorced    Spouse name: Not on file   Number of children: Not on file   Years of education: Not on file   Highest education level: Not on file  Occupational History   Not on file  Tobacco Use   Smoking status: Former    Types: Cigarettes   Smokeless tobacco: Never  Vaping Use   Vaping Use: Former  Substance and Sexual Activity   Alcohol use: Yes    Comment: occasionally   Drug use: Never   Sexual activity: Not on file  Other Topics Concern   Not on file  Social History Narrative   Not on file   Social Determinants of Health   Financial Resource Strain: Not on file  Food Insecurity: Not on file  Transportation Needs: Not on file  Physical Activity: Not on file  Stress: Not on file  Social Connections: Not on file  Intimate Partner Violence: Not on file      Review of Systems  Constitutional:  Positive for fatigue. Negative for activity change, appetite change, chills, fever and unexpected weight change.  HENT:  Positive for congestion, postnasal drip, rhinorrhea, sinus pressure, sinus pain and sore throat. Negative for ear pain and trouble swallowing.  Sensitivity to sound associated with migraines  Eyes:  Positive for photophobia (associated with migraines).  Respiratory:  Positive for cough. Negative for chest tightness, shortness of breath and wheezing.   Cardiovascular: Negative.  Negative for chest pain.  Gastrointestinal:  Positive for nausea (associated with migraines). Negative for abdominal pain, blood in stool, constipation, diarrhea and vomiting.  Endocrine: Negative.   Genitourinary: Negative.  Negative for difficulty urinating, dysuria, frequency, hematuria and urgency.  Musculoskeletal: Negative.  Negative for arthralgias, back pain, joint swelling, myalgias and neck  pain.  Skin: Negative.  Negative for rash and wound.  Allergic/Immunologic: Negative.  Negative for immunocompromised state.  Neurological:  Positive for dizziness (associated with migraines) and headaches. Negative for seizures and numbness.  Hematological: Negative.   Psychiatric/Behavioral: Negative.  Negative for behavioral problems, self-injury and suicidal ideas. The patient is not nervous/anxious.     Vital Signs: BP 125/80 Comment: 148/103  Pulse 85   Temp 98.2 F (36.8 C)   Resp 16   Ht '5\' 5"'$  (1.651 m)   Wt 198 lb 6.4 oz (90 kg)   SpO2 98%   BMI 33.02 kg/m    Physical Exam Vitals reviewed.  Constitutional:      General: She is not in acute distress.    Appearance: Normal appearance. She is obese. She is ill-appearing.  HENT:     Head: Normocephalic and atraumatic.     Right Ear: Hearing, tympanic membrane, ear canal and external ear normal.     Left Ear: Decreased hearing noted. A middle ear effusion is present. Tympanic membrane is erythematous and bulging.     Nose: Mucosal edema, congestion and rhinorrhea present.     Right Turbinates: Swollen and pale.     Left Turbinates: Swollen and pale.     Right Sinus: Frontal sinus tenderness present.     Left Sinus: Frontal sinus tenderness present.  Eyes:     Pupils: Pupils are equal, round, and reactive to light.  Cardiovascular:     Rate and Rhythm: Normal rate and regular rhythm.  Pulmonary:     Effort: Pulmonary effort is normal. No respiratory distress.     Breath sounds: Normal breath sounds and air entry.  Neurological:     Mental Status: She is alert and oriented to person, place, and time.  Psychiatric:        Mood and Affect: Mood normal.        Behavior: Behavior normal.        Assessment/Plan: 1. Acute non-recurrent frontal sinusitis Empiric antibiotic tx prescribed with zpak - azithromycin (ZITHROMAX) 250 MG tablet; Take 2 tablets on day 1, then 1 tablet daily on days 2 through 5  Dispense: 6  tablet; Refill: 0  2. Essential hypertension Stable, continue medications as prescribed  3. Migraine with aura and without status migrainosus, not intractable Stable, continue medication as prescribed.   4. Class 1 obesity due to excess calories with serious comorbidity and body mass index (BMI) of 30.0 to 30.9 in adult Start contrave, follow up in 1 month   General Counseling: Jeneane verbalizes understanding of the findings of todays visit and agrees with plan of treatment. I have discussed any further diagnostic evaluation that may be needed or ordered today. We also reviewed her medications today. she has been encouraged to call the office with any questions or concerns that should arise related to todays visit.    No orders of the defined types were placed in this encounter.   Meds ordered  this encounter  Medications   azithromycin (ZITHROMAX) 250 MG tablet    Sig: Take 2 tablets on day 1, then 1 tablet daily on days 2 through 5    Dispense:  6 tablet    Refill:  0    Return in about 1 month (around 06/29/2022) for F/U, Weight loss, Ciena Sampley PCP.   Total time spent:30 Minutes Time spent includes review of chart, medications, test results, and follow up plan with the patient.   [place] Controlled Substance Database was reviewed by me.  This patient was seen by Jonetta Osgood, FNP-C in collaboration with Dr. Clayborn Bigness as a part of collaborative care agreement.   Jackelyn Illingworth R. Valetta Fuller, MSN, FNP-C Internal medicine

## 2022-06-08 ENCOUNTER — Telehealth: Payer: Self-pay

## 2022-06-08 NOTE — Telephone Encounter (Signed)
Sent P.A. for patient's Qulitpa.

## 2022-06-16 ENCOUNTER — Encounter: Payer: Self-pay | Admitting: Nurse Practitioner

## 2022-06-29 ENCOUNTER — Ambulatory Visit (INDEPENDENT_AMBULATORY_CARE_PROVIDER_SITE_OTHER): Payer: Managed Care, Other (non HMO) | Admitting: Nurse Practitioner

## 2022-06-29 ENCOUNTER — Encounter: Payer: Self-pay | Admitting: Nurse Practitioner

## 2022-06-29 VITALS — BP 120/70 | HR 74 | Temp 97.7°F | Resp 16 | Ht 65.0 in | Wt 204.6 lb

## 2022-06-29 DIAGNOSIS — G43109 Migraine with aura, not intractable, without status migrainosus: Secondary | ICD-10-CM

## 2022-06-29 DIAGNOSIS — Z6834 Body mass index (BMI) 34.0-34.9, adult: Secondary | ICD-10-CM | POA: Diagnosis not present

## 2022-06-29 DIAGNOSIS — I10 Essential (primary) hypertension: Secondary | ICD-10-CM

## 2022-06-29 DIAGNOSIS — E6609 Other obesity due to excess calories: Secondary | ICD-10-CM | POA: Diagnosis not present

## 2022-06-29 MED ORDER — ZOLMITRIPTAN 5 MG PO TBDP: 5.0000 mg | ORAL_TABLET | ORAL | 2 refills | Status: DC | PRN

## 2022-06-29 MED ORDER — TIRZEPATIDE-WEIGHT MANAGEMENT 5 MG/0.5ML ~~LOC~~ SOAJ: 5.0000 mg | SUBCUTANEOUS | 2 refills | Status: DC

## 2022-06-29 NOTE — Progress Notes (Signed)
[facility] [place], [place] [address]  Internal MEDICINE  Office Visit Note  Patient Name: Barbara Stephens  [id number]  [id number]  Date of Service: 06/29/2022  Chief Complaint  Patient presents with   Hypertension   Follow-up    HPI Barbara Stephens presents for a follow-up visit forweight loss, migraines and hypertension.  Weight loss -- gained 6 lbs, taking contrave, feels like her appetite has increased and that she has been eating more. Migraines are controlled need refill of zomig Recheck blood pressure, improved with recheck.  Wants to pay out of pocket for zepbound    Current Medication: Outpatient Encounter Medications as of 06/29/2022  Medication Sig   Atogepant (QULIPTA) 60 MG TABS Take 60 mg by mouth daily.   metoprolol succinate (TOPROL-XL) 25 MG 24 hr tablet TAKE 1 TABLET(25 MG) BY MOUTH DAILY   Naltrexone-buPROPion HCl ER 8-90 MG TB12 Take 1 tablet by mouth in the morning and at bedtime.   tirzepatide (ZEPBOUND) 5 MG/0.5ML Pen Inject 5 mg into the skin once a week.   Ubrogepant (UBRELVY) 100 MG TABS Take 100 mg by mouth daily as needed (acute migraine).   [DISCONTINUED] ALPRAZolam (XANAX) 0.25 MG tablet Take 1 tablet (0.25 mg total) by mouth at bedtime as needed for anxiety.   [DISCONTINUED] zolmitriptan (ZOMIG-ZMT) 5 MG disintegrating tablet Take 1 tablet (5 mg total) by mouth as needed for migraine.   zolmitriptan (ZOMIG-ZMT) 5 MG disintegrating tablet Take 1 tablet (5 mg total) by mouth as needed for migraine.   No facility-administered encounter medications on file as of 06/29/2022.    Surgical History: Past Surgical History:  Procedure Laterality Date   CESAREAN SECTION     COLONOSCOPY WITH PROPOFOL N/A 01/12/2021   Procedure: COLONOSCOPY WITH PROPOFOL;  Surgeon: Jonathon Bellows, MD;  Location: [facility] ENDOSCOPY;  Service: Gastroenterology;  Laterality: N/A;    Medical History: Past Medical History:  Diagnosis Date   Hypertension     Migraines    Premenopausal patient     Family History: Family History  Problem Relation Age of Onset   Thyroid disease Mother    Heart disease Mother    Lung cancer Father    Diabetes Father    Breast cancer Neg Hx     Social History   Socioeconomic History   Marital status: Divorced    Spouse name: Not on file   Number of children: Not on file   Years of education: Not on file   Highest education level: Not on file  Occupational History   Not on file  Tobacco Use   Smoking status: Former    Types: Cigarettes   Smokeless tobacco: Never  Vaping Use   Vaping Use: Former  Substance and Sexual Activity   Alcohol use: Yes    Comment: occasionally   Drug use: Never   Sexual activity: Not on file  Other Topics Concern   Not on file  Social History Narrative   Not on file   Social Determinants of Health   Financial Resource Strain: Not on file  Food Insecurity: Not on file  Transportation Needs: Not on file  Physical Activity: Not on file  Stress: Not on file  Social Connections: Not on file  Intimate Partner Violence: Not on file      Review of Systems  Constitutional:  Negative for activity change, appetite change, chills, fatigue, fever and unexpected weight change.  HENT: Negative.  Negative for congestion, ear pain, rhinorrhea, sore throat and  trouble swallowing.        Sensitivity to sound associated with migraines  Eyes:  Positive for photophobia (associated with migraines).  Respiratory: Negative.  Negative for cough, chest tightness, shortness of breath and wheezing.   Cardiovascular: Negative.  Negative for chest pain.  Gastrointestinal:  Positive for nausea (associated with migraines). Negative for abdominal pain, blood in stool, constipation, diarrhea and vomiting.  Endocrine: Negative.   Genitourinary: Negative.  Negative for difficulty urinating, dysuria, frequency, hematuria and urgency.  Musculoskeletal: Negative.  Negative for arthralgias, back  pain, joint swelling, myalgias and neck pain.  Skin: Negative.  Negative for rash and wound.  Allergic/Immunologic: Negative.  Negative for immunocompromised state.  Neurological:  Positive for dizziness (associated with migraines) and headaches. Negative for seizures and numbness.  Hematological: Negative.   Psychiatric/Behavioral: Negative.  Negative for behavioral problems, self-injury and suicidal ideas. The patient is not nervous/anxious.     Vital Signs: BP 120/70 Comment: 151/97  Pulse 74   Temp 97.7 F (36.5 C)   Resp 16   Ht 5\' 5"  (1.651 m)   Wt 204 lb 9.6 oz (92.8 kg)   SpO2 96%   BMI 34.05 kg/m    Physical Exam Vitals reviewed.  Constitutional:      General: She is not in acute distress.    Appearance: Normal appearance. She is obese. She is not ill-appearing.  HENT:     Head: Normocephalic and atraumatic.  Eyes:     Pupils: Pupils are equal, round, and reactive to light.  Cardiovascular:     Rate and Rhythm: Normal rate and regular rhythm.  Pulmonary:     Effort: Pulmonary effort is normal. No respiratory distress.  Neurological:     Mental Status: She is alert and oriented to person, place, and time.  Psychiatric:        Mood and Affect: Mood normal.        Behavior: Behavior normal.        Assessment/Plan: 1. Migraine with aura and without status migrainosus, not intractable Stable, continue medications as prescribed. Refills ordered for zomig - zolmitriptan (ZOMIG-ZMT) 5 MG disintegrating tablet; Take 1 tablet (5 mg total) by mouth as needed for migraine.  Dispense: 10 tablet; Refill: 2  2. Essential hypertension Stable, continue medications as prescribed.   3. Class 1 obesity due to excess calories with serious comorbidity and body mass index (BMI) of 34.0 to 34.9 in adult Contrave is not helping, having increased appetite. Weight gain of 6 lbs since last visit. Wants to try zepbound and pay out of pocket. Resent prescription for zepbound.  -  tirzepatide (ZEPBOUND) 5 MG/0.5ML Pen; Inject 5 mg into the skin once a week.  Dispense: 2 mL; Refill: 2   General Counseling: Barbara Stephens verbalizes understanding of the findings of todays visit and agrees with plan of treatment. I have discussed any further diagnostic evaluation that may be needed or ordered today. We also reviewed her medications today. she has been encouraged to call the office with any questions or concerns that should arise related to todays visit.    No orders of the defined types were placed in this encounter.   Meds ordered this encounter  Medications   zolmitriptan (ZOMIG-ZMT) 5 MG disintegrating tablet    Sig: Take 1 tablet (5 mg total) by mouth as needed for migraine.    Dispense:  10 tablet    Refill:  2   tirzepatide (ZEPBOUND) 5 MG/0.5ML Pen    Sig: Inject 5 mg  into the skin once a week.    Dispense:  2 mL    Refill:  2    Dx code E66.01, patient has a coupon and will pay out of pocket, please fill.    Return in about 2 months (around 08/29/2022) for F/U, Weight loss, Tyray Proch PCP.   Total time spent:30 Minutes Time spent includes review of chart, medications, test results, and follow up plan with the patient.   [place] Controlled Substance Database was reviewed by me.  This patient was seen by Jonetta Osgood, FNP-C in collaboration with Dr. Clayborn Bigness as a part of collaborative care agreement.   Colbi Staubs R. Valetta Fuller, MSN, FNP-C Internal medicine

## 2022-07-03 ENCOUNTER — Ambulatory Visit: Payer: Managed Care, Other (non HMO) | Admitting: Nurse Practitioner

## 2022-07-16 ENCOUNTER — Encounter: Payer: Self-pay | Admitting: Nurse Practitioner

## 2022-07-16 MED ORDER — TIRZEPATIDE-WEIGHT MANAGEMENT 7.5 MG/0.5ML ~~LOC~~ SOAJ: 7.5000 mg | SUBCUTANEOUS | 2 refills | Status: DC

## 2022-07-20 MED ORDER — TIRZEPATIDE-WEIGHT MANAGEMENT 7.5 MG/0.5ML ~~LOC~~ SOAJ: 7.5000 mg | SUBCUTANEOUS | 2 refills | Status: DC

## 2022-08-10 MED ORDER — WEGOVY 1 MG/0.5ML ~~LOC~~ SOAJ: 1.0000 mg | SUBCUTANEOUS | 2 refills | Status: DC

## 2022-08-10 NOTE — Addendum Note (Signed)
Addended by: Sallyanne Kuster on: 08/10/2022 09:11 AM   Modules accepted: Orders

## 2022-08-29 ENCOUNTER — Encounter: Payer: Self-pay | Admitting: Nurse Practitioner

## 2022-08-29 ENCOUNTER — Ambulatory Visit (INDEPENDENT_AMBULATORY_CARE_PROVIDER_SITE_OTHER): Payer: Managed Care, Other (non HMO) | Admitting: Nurse Practitioner

## 2022-08-29 VITALS — BP 130/84 | HR 90 | Temp 98.4°F | Resp 16 | Ht 65.0 in | Wt 200.6 lb

## 2022-08-29 DIAGNOSIS — Z6834 Body mass index (BMI) 34.0-34.9, adult: Secondary | ICD-10-CM

## 2022-08-29 DIAGNOSIS — Z9189 Other specified personal risk factors, not elsewhere classified: Secondary | ICD-10-CM | POA: Diagnosis not present

## 2022-08-29 DIAGNOSIS — G43E19 Chronic migraine with aura, intractable, without status migrainosus: Secondary | ICD-10-CM | POA: Diagnosis not present

## 2022-08-29 DIAGNOSIS — E6609 Other obesity due to excess calories: Secondary | ICD-10-CM | POA: Diagnosis not present

## 2022-08-29 DIAGNOSIS — I10 Essential (primary) hypertension: Secondary | ICD-10-CM

## 2022-08-29 MED ORDER — METOPROLOL SUCCINATE ER 25 MG PO TB24: ORAL_TABLET | ORAL | 1 refills | Status: DC

## 2022-08-29 MED ORDER — UBRELVY 100 MG PO TABS: 100.0000 mg | ORAL_TABLET | Freq: Every day | ORAL | 2 refills | Status: DC | PRN

## 2022-08-29 MED ORDER — WEGOVY 1 MG/0.5ML ~~LOC~~ SOAJ: 1.0000 mg | SUBCUTANEOUS | 2 refills | Status: DC

## 2022-08-29 NOTE — Progress Notes (Signed)
[facility] [address] [place], [place] [address]  Internal MEDICINE  Office Visit Note  Patient Name: Barbara Stephens  [id number]  [id number]  Date of Service: 08/29/2022  Chief Complaint  Patient presents with   Hypertension   Follow-up    HPI Kemisha presents for a follow-up visit for migraines, hypertension, migraines and CV risk Migraines -- controlled on qulipta and has ubrelvy and zomig for acute migraines.  Weight loss -- tried to order wegovy but no PA was sent, need to resend prescription.  Hypertension -- blood pressure is controlled with current medications Increased risk of CV events The 10-year ASCVD risk score (Arnett DK, et al., 2019) is: 2.1%   Values used to calculate the score:     Age: 47 years     Sex: Female     Is Non-Hispanic African American: No     Diabetic: No     Tobacco smoker: No     Systolic Blood Pressure: 130 mmHg     Is BP treated: Yes     HDL Cholesterol: 40 mg/dL     Total Cholesterol: 205 mg/dL     Current Medication: Outpatient Encounter Medications as of 08/29/2022  Medication Sig   Atogepant (QULIPTA) 60 MG TABS Take 60 mg by mouth daily.   zolmitriptan (ZOMIG-ZMT) 5 MG disintegrating tablet Take 1 tablet (5 mg total) by mouth as needed for migraine.   [DISCONTINUED] metoprolol succinate (TOPROL-XL) 25 MG 24 hr tablet TAKE 1 TABLET(25 MG) BY MOUTH DAILY   [DISCONTINUED] Naltrexone-buPROPion HCl ER 8-90 MG TB12 Take 1 tablet by mouth in the morning and at bedtime.   [DISCONTINUED] Semaglutide-Weight Management (WEGOVY) 1 MG/0.5ML SOAJ Inject 1 mg into the skin once a week.   [DISCONTINUED] Ubrogepant (UBRELVY) 100 MG TABS Take 100 mg by mouth daily as needed (acute migraine).   metoprolol succinate (TOPROL-XL) 25 MG 24 hr tablet TAKE 1 TABLET(25 MG) BY MOUTH DAILY   Semaglutide-Weight Management (WEGOVY) 1 MG/0.5ML SOAJ Inject 1 mg into the skin once a week.   Ubrogepant (UBRELVY) 100 MG TABS Take 1 tablet (100 mg  total) by mouth daily as needed (acute migraine).   No facility-administered encounter medications on file as of 08/29/2022.    Surgical History: Past Surgical History:  Procedure Laterality Date   CESAREAN SECTION     COLONOSCOPY WITH PROPOFOL N/A 01/12/2021   Procedure: COLONOSCOPY WITH PROPOFOL;  Surgeon: Wyline Mood, MD;  Location: [facility] ENDOSCOPY;  Service: Gastroenterology;  Laterality: N/A;    Medical History: Past Medical History:  Diagnosis Date   Hypertension    Migraines    Premenopausal patient     Family History: Family History  Problem Relation Age of Onset   Thyroid disease Mother    Heart disease Mother    Lung cancer Father    Diabetes Father    Breast cancer Neg Hx     Social History   Socioeconomic History   Marital status: Divorced    Spouse name: Not on file   Number of children: Not on file   Years of education: Not on file   Highest education level: Not on file  Occupational History   Not on file  Tobacco Use   Smoking status: Former    Types: Cigarettes   Smokeless tobacco: Never  Vaping Use   Vaping Use: Former  Substance and Sexual Activity   Alcohol use: Yes    Comment: occasionally   Drug use: Never   Sexual activity:  Not on file  Other Topics Concern   Not on file  Social History Narrative   Not on file   Social Determinants of Health   Financial Resource Strain: Not on file  Food Insecurity: Not on file  Transportation Needs: Not on file  Physical Activity: Not on file  Stress: Not on file  Social Connections: Not on file  Intimate Partner Violence: Not on file      Review of Systems  Constitutional:  Positive for unexpected weight change. Negative for activity change, appetite change, chills, fatigue and fever.  HENT: Negative.  Negative for congestion, ear pain, rhinorrhea, sore throat and trouble swallowing.        Sensitivity to sound associated with migraines  Eyes:  Positive for photophobia (associated with  migraines).  Respiratory: Negative.  Negative for cough, chest tightness, shortness of breath and wheezing.   Cardiovascular: Negative.  Negative for chest pain.  Gastrointestinal:  Positive for nausea (associated with migraines). Negative for abdominal pain, blood in stool, constipation, diarrhea and vomiting.  Endocrine: Negative.   Genitourinary: Negative.  Negative for difficulty urinating, dysuria, frequency, hematuria and urgency.  Musculoskeletal: Negative.  Negative for arthralgias, back pain, joint swelling, myalgias and neck pain.  Skin: Negative.  Negative for rash and wound.  Allergic/Immunologic: Negative.  Negative for immunocompromised state.  Neurological:  Positive for dizziness (associated with migraines) and headaches. Negative for seizures and numbness.  Hematological: Negative.   Psychiatric/Behavioral: Negative.  Negative for behavioral problems, self-injury and suicidal ideas. The patient is not nervous/anxious.     Vital Signs: BP 130/84   Pulse 90   Temp 98.4 F (36.9 C)   Resp 16   Ht 5\' 5"  (1.651 m)   Wt 200 lb 9.6 oz (91 kg)   SpO2 97%   BMI 33.38 kg/m    Physical Exam Vitals reviewed.  Constitutional:      General: She is not in acute distress.    Appearance: Normal appearance. She is obese. She is not ill-appearing.  HENT:     Head: Normocephalic and atraumatic.  Eyes:     Pupils: Pupils are equal, round, and reactive to light.  Cardiovascular:     Rate and Rhythm: Normal rate and regular rhythm.  Pulmonary:     Effort: Pulmonary effort is normal. No respiratory distress.  Neurological:     Mental Status: She is alert and oriented to person, place, and time.  Psychiatric:        Mood and Affect: Mood normal.        Behavior: Behavior normal.        Assessment/Plan: 1. Essential hypertension Continue metoprolol as prescribed.  - metoprolol succinate (TOPROL-XL) 25 MG 24 hr tablet; TAKE 1 TABLET(25 MG) BY MOUTH DAILY  Dispense: 90  tablet; Refill: 1  2. Intractable chronic migraine with aura and without status migrainosus Continue qulipta and ubrelvy as prescribed - Ubrogepant (UBRELVY) 100 MG TABS; Take 1 tablet (100 mg total) by mouth daily as needed (acute migraine).  Dispense: 16 tablet; Refill: 2  3. Class 1 obesity due to excess calories with serious comorbidity and body mass index (BMI) of 34.0 to 34.9 in adult [facility] prescribed, will need to complete PA - Semaglutide-Weight Management (WEGOVY) 1 MG/0.5ML SOAJ; Inject 1 mg into the skin once a week.  Dispense: 2 mL; Refill: 2  4. At increased risk for cardiovascular disease Wegovy prescribed, need to complete PA - Semaglutide-Weight Management (WEGOVY) 1 MG/0.5ML SOAJ; Inject 1 mg into the  skin once a week.  Dispense: 2 mL; Refill: 2   General Counseling: Ahriana verbalizes understanding of the findings of todays visit and agrees with plan of treatment. I have discussed any further diagnostic evaluation that may be needed or ordered today. We also reviewed her medications today. she has been encouraged to call the office with any questions or concerns that should arise related to todays visit.    No orders of the defined types were placed in this encounter.   Meds ordered this encounter  Medications   Semaglutide-Weight Management (WEGOVY) 1 MG/0.5ML SOAJ    Sig: Inject 1 mg into the skin once a week.    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 PLEASE SEND PRIOR AUTH REQUEST ASAP SO WE CAN COMPLETE IT. FAX # [phone]   Ubrogepant (UBRELVY) 100 MG TABS    Sig: Take 1 tablet (100 mg total) by mouth daily as needed (acute migraine).    Dispense:  16 tablet    Refill:  2   metoprolol succinate (TOPROL-XL) 25 MG 24 hr tablet    Sig: TAKE 1 TABLET(25 MG) BY MOUTH DAILY    Dispense:  90 tablet    Refill:  1    Return in about 3 months (around 11/29/2022) for F/U, Weight loss, Adriell Polansky PCP.   Total time spent:30 Minutes Time spent includes  review of chart, medications, test results, and follow up plan with the patient.   [place] Controlled Substance Database was reviewed by me.  This patient was seen by Sallyanne Kuster, FNP-C in collaboration with Dr. Beverely Risen as a part of collaborative care agreement.   Jayel Inks R. Tedd Sias, MSN, FNP-C Internal medicine

## 2022-09-08 ENCOUNTER — Encounter: Payer: Self-pay | Admitting: Nurse Practitioner

## 2022-10-19 ENCOUNTER — Other Ambulatory Visit: Payer: Self-pay

## 2022-10-19 DIAGNOSIS — G43109 Migraine with aura, not intractable, without status migrainosus: Secondary | ICD-10-CM

## 2022-10-19 MED ORDER — ZOLMITRIPTAN 5 MG PO TBDP: 5.0000 mg | ORAL_TABLET | ORAL | 2 refills | Status: DC | PRN

## 2022-11-29 ENCOUNTER — Ambulatory Visit: Payer: Managed Care, Other (non HMO) | Admitting: Nurse Practitioner

## 2022-12-03 ENCOUNTER — Telehealth: Payer: Self-pay | Admitting: Nurse Practitioner

## 2022-12-03 NOTE — Telephone Encounter (Signed)
Called pt to reschedule no show on 12/24/22, pt had death in the family

## 2022-12-05 ENCOUNTER — Encounter: Payer: Self-pay | Admitting: Nurse Practitioner

## 2022-12-05 ENCOUNTER — Ambulatory Visit (INDEPENDENT_AMBULATORY_CARE_PROVIDER_SITE_OTHER): Payer: Managed Care, Other (non HMO) | Admitting: Nurse Practitioner

## 2022-12-05 VITALS — BP 130/84 | HR 100 | Temp 98.7°F | Resp 16 | Ht 65.0 in | Wt 199.8 lb

## 2022-12-05 DIAGNOSIS — N951 Menopausal and female climacteric states: Secondary | ICD-10-CM

## 2022-12-05 DIAGNOSIS — G43E19 Chronic migraine with aura, intractable, without status migrainosus: Secondary | ICD-10-CM

## 2022-12-05 DIAGNOSIS — H60331 Swimmer's ear, right ear: Secondary | ICD-10-CM

## 2022-12-05 DIAGNOSIS — G43109 Migraine with aura, not intractable, without status migrainosus: Secondary | ICD-10-CM

## 2022-12-05 DIAGNOSIS — I10 Essential (primary) hypertension: Secondary | ICD-10-CM | POA: Diagnosis not present

## 2022-12-05 MED ORDER — OFLOXACIN 0.3 % OT SOLN: 5.0000 [drp] | Freq: Every day | OTIC | 0 refills | Status: AC

## 2022-12-05 MED ORDER — METOPROLOL SUCCINATE ER 25 MG PO TB24: ORAL_TABLET | ORAL | 1 refills | Status: DC

## 2022-12-05 MED ORDER — QULIPTA 60 MG PO TABS: 60.0000 mg | ORAL_TABLET | Freq: Every day | ORAL | 11 refills | Status: DC

## 2022-12-05 MED ORDER — VEOZAH 45 MG PO TABS: 1.0000 | ORAL_TABLET | Freq: Every day | ORAL | 3 refills | Status: DC

## 2022-12-05 MED ORDER — UBRELVY 100 MG PO TABS: 100.0000 mg | ORAL_TABLET | Freq: Every day | ORAL | 2 refills | Status: DC | PRN

## 2022-12-05 NOTE — Progress Notes (Signed)
[facility] [address] [place], [place] [address]  Internal MEDICINE  Office Visit Note  Patient Name: Barbara Stephens  [id number]  [id number]  Date of Service: 12/05/2022  Chief Complaint  Patient presents with   Hypertension   Follow-up    Weight loss     HPI Haliegh presents for a follow-up visit for migraines, weight loss, right ear pain.  Migraines -- qulipta was denied, does not have any right now, will give samples. Will appeal denial.  Hypertension -- controlled with current medications.  Hot flashes -- requesting to try veozah after hearing about it from someone else.  Reports pain in the right ear, which has been going on for several days.     Current Medication: Outpatient Encounter Medications as of 12/05/2022  Medication Sig   Fezolinetant (VEOZAH) 45 MG TABS Take 1 tablet (45 mg total) by mouth daily.   Semaglutide-Weight Management (WEGOVY) 1 MG/0.5ML SOAJ Inject 1 mg into the skin once a week.   zolmitriptan (ZOMIG-ZMT) 5 MG disintegrating tablet Take 1 tablet (5 mg total) by mouth as needed for migraine.   [DISCONTINUED] Atogepant (QULIPTA) 60 MG TABS Take 60 mg by mouth daily.   [DISCONTINUED] metoprolol succinate (TOPROL-XL) 25 MG 24 hr tablet TAKE 1 TABLET(25 MG) BY MOUTH DAILY   [DISCONTINUED] Ubrogepant (UBRELVY) 100 MG TABS Take 1 tablet (100 mg total) by mouth daily as needed (acute migraine).   Atogepant (QULIPTA) 60 MG TABS Take 1 tablet (60 mg total) by mouth daily.   metoprolol succinate (TOPROL-XL) 25 MG 24 hr tablet TAKE 1 TABLET(25 MG) BY MOUTH DAILY   Ubrogepant (UBRELVY) 100 MG TABS Take 1 tablet (100 mg total) by mouth daily as needed (acute migraine).   No facility-administered encounter medications on file as of 12/05/2022.    Surgical History: Past Surgical History:  Procedure Laterality Date   CESAREAN SECTION     COLONOSCOPY WITH PROPOFOL N/A 01/12/2021   Procedure: COLONOSCOPY WITH PROPOFOL;  Surgeon: Wyline Mood, MD;   Location: [facility] ENDOSCOPY;  Service: Gastroenterology;  Laterality: N/A;    Medical History: Past Medical History:  Diagnosis Date   Hypertension    Migraines    Premenopausal patient     Family History: Family History  Problem Relation Age of Onset   Thyroid disease Mother    Heart disease Mother    Lung cancer Father    Diabetes Father    Breast cancer Neg Hx     Social History   Socioeconomic History   Marital status: Divorced    Spouse name: Not on file   Number of children: Not on file   Years of education: Not on file   Highest education level: Not on file  Occupational History   Not on file  Tobacco Use   Smoking status: Former    Types: Cigarettes   Smokeless tobacco: Never  Vaping Use   Vaping status: Former  Substance and Sexual Activity   Alcohol use: Yes    Comment: occasionally   Drug use: Never   Sexual activity: Not on file  Other Topics Concern   Not on file  Social History Narrative   Not on file   Social Determinants of Health   Financial Resource Strain: Not on file  Food Insecurity: Not on file  Transportation Needs: Not on file  Physical Activity: Not on file  Stress: Not on file  Social Connections: Unknown (08/14/2021)   Received from [facility]   Social Network  Social Network: Not on file  Intimate Partner Violence: Unknown (08/14/2021)   Received from [facility]   HITS    Physically Hurt: Not on file    Insult or Talk Down To: Not on file    Threaten Physical Harm: Not on file    Scream or Curse: Not on file      Review of Systems  Constitutional:  Positive for unexpected weight change. Negative for activity change, appetite change, chills, fatigue and fever.  HENT: Negative.  Negative for congestion, ear pain, rhinorrhea, sore throat and trouble swallowing.        Sensitivity to sound associated with migraines  Eyes:  Positive for photophobia (associated with migraines).  Respiratory: Negative.  Negative for  cough, chest tightness, shortness of breath and wheezing.   Cardiovascular: Negative.  Negative for chest pain.  Gastrointestinal:  Positive for nausea (associated with migraines). Negative for abdominal pain, blood in stool, constipation, diarrhea and vomiting.  Endocrine: Negative.   Genitourinary: Negative.  Negative for difficulty urinating, dysuria, frequency, hematuria and urgency.  Musculoskeletal: Negative.  Negative for arthralgias, back pain, joint swelling, myalgias and neck pain.  Skin: Negative.  Negative for rash and wound.  Allergic/Immunologic: Negative.  Negative for immunocompromised state.  Neurological:  Positive for dizziness (associated with migraines) and headaches. Negative for seizures and numbness.  Hematological: Negative.   Psychiatric/Behavioral: Negative.  Negative for behavioral problems, self-injury and suicidal ideas. The patient is not nervous/anxious.     Vital Signs: BP 130/84   Pulse 100   Temp 98.7 F (37.1 C)   Resp 16   Ht 5\' 5"  (1.651 m)   Wt 199 lb 12.8 oz (90.6 kg)   SpO2 97%   BMI 33.25 kg/m    Physical Exam Vitals reviewed.  Constitutional:      General: She is not in acute distress.    Appearance: Normal appearance. She is obese. She is not ill-appearing.  HENT:     Head: Normocephalic and atraumatic.  Eyes:     Pupils: Pupils are equal, round, and reactive to light.  Cardiovascular:     Rate and Rhythm: Normal rate and regular rhythm.  Pulmonary:     Effort: Pulmonary effort is normal. No respiratory distress.  Neurological:     Mental Status: She is alert and oriented to person, place, and time.  Psychiatric:        Mood and Affect: Mood normal.        Behavior: Behavior normal.        Assessment/Plan: 1. Essential hypertension Continue metoprolol as prescribed.  - metoprolol succinate (TOPROL-XL) 25 MG 24 hr tablet; TAKE 1 TABLET(25 MG) BY MOUTH DAILY  Dispense: 90 tablet; Refill: 1  2. Intractable chronic  migraine with aura and without status migrainosus Ubrelvy and qulipta reordered.  - Atogepant (QULIPTA) 60 MG TABS; Take 1 tablet (60 mg total) by mouth daily.  Dispense: 30 tablet; Refill: 11 - Ubrogepant (UBRELVY) 100 MG TABS; Take 1 tablet (100 mg total) by mouth daily as needed (acute migraine).  Dispense: 16 tablet; Refill: 2  3. Acute swimmer's ear of right side Antibiotic ear drops ordered.  - ofloxacin (FLOXIN) 0.3 % OTIC solution; Place 5 drops into the right ear daily for 5 days.  Dispense: 5 mL; Refill: 0  4. Hot flashes due to menopause Veozah ordered, we will see if her insurance covers it and if the opay is affordable.  - Fezolinetant (VEOZAH) 45 MG TABS; Take 1 tablet (45 mg  total) by mouth daily.  Dispense: 30 tablet; Refill: 3   General Counseling: Yexalen verbalizes understanding of the findings of todays visit and agrees with plan of treatment. I have discussed any further diagnostic evaluation that may be needed or ordered today. We also reviewed her medications today. she has been encouraged to call the office with any questions or concerns that should arise related to todays visit.    No orders of the defined types were placed in this encounter.   Meds ordered this encounter  Medications   Fezolinetant (VEOZAH) 45 MG TABS    Sig: Take 1 tablet (45 mg total) by mouth daily.    Dispense:  30 tablet    Refill:  3   Atogepant (QULIPTA) 60 MG TABS    Sig: Take 1 tablet (60 mg total) by mouth daily.    Dispense:  30 tablet    Refill:  11    SEND PRIOR AUTHORIZATION ASAP TO FAX # [phone] --needs to be renewed and patient is out of medication   metoprolol succinate (TOPROL-XL) 25 MG 24 hr tablet    Sig: TAKE 1 TABLET(25 MG) BY MOUTH DAILY    Dispense:  90 tablet    Refill:  1   Ubrogepant (UBRELVY) 100 MG TABS    Sig: Take 1 tablet (100 mg total) by mouth daily as needed (acute migraine).    Dispense:  16 tablet    Refill:  2    Return in about 3 months  (around 03/07/2023) for F/U, Lener Ventresca PCP.   Total time spent:30 Minutes Time spent includes review of chart, medications, test results, and follow up plan with the patient.   [place] Controlled Substance Database was reviewed by me.  This patient was seen by Sallyanne Kuster, FNP-C in collaboration with Dr. Beverely Risen as a part of collaborative care agreement.   Rhyland Hinderliter R. Tedd Sias, MSN, FNP-C Internal medicine

## 2022-12-06 ENCOUNTER — Telehealth: Payer: Self-pay

## 2022-12-20 ENCOUNTER — Telehealth: Payer: Self-pay

## 2022-12-20 NOTE — Telephone Encounter (Signed)
Completed appeal for patient's Veozah.

## 2023-01-06 ENCOUNTER — Encounter: Payer: Self-pay | Admitting: Nurse Practitioner

## 2023-01-08 ENCOUNTER — Encounter: Payer: Managed Care, Other (non HMO) | Admitting: Nurse Practitioner

## 2023-02-06 NOTE — Telephone Encounter (Signed)
PA for qulipta was denied.

## 2023-03-06 ENCOUNTER — Encounter: Payer: Self-pay | Admitting: Nurse Practitioner

## 2023-03-06 ENCOUNTER — Ambulatory Visit (INDEPENDENT_AMBULATORY_CARE_PROVIDER_SITE_OTHER): Payer: Managed Care, Other (non HMO) | Admitting: Nurse Practitioner

## 2023-03-06 VITALS — BP 128/80 | HR 75 | Temp 98.6°F | Resp 16 | Ht 65.0 in | Wt 208.6 lb

## 2023-03-06 DIAGNOSIS — E66811 Obesity, class 1: Secondary | ICD-10-CM | POA: Diagnosis not present

## 2023-03-06 DIAGNOSIS — I10 Essential (primary) hypertension: Secondary | ICD-10-CM

## 2023-03-06 DIAGNOSIS — G43E19 Chronic migraine with aura, intractable, without status migrainosus: Secondary | ICD-10-CM

## 2023-03-06 DIAGNOSIS — N951 Menopausal and female climacteric states: Secondary | ICD-10-CM | POA: Diagnosis not present

## 2023-03-06 DIAGNOSIS — Z9189 Other specified personal risk factors, not elsewhere classified: Secondary | ICD-10-CM

## 2023-03-06 DIAGNOSIS — Z6834 Body mass index (BMI) 34.0-34.9, adult: Secondary | ICD-10-CM

## 2023-03-06 DIAGNOSIS — E6609 Other obesity due to excess calories: Secondary | ICD-10-CM

## 2023-03-06 MED ORDER — VEOZAH 45 MG PO TABS: 1.0000 | ORAL_TABLET | Freq: Every day | ORAL | 3 refills | Status: DC

## 2023-03-06 MED ORDER — WEGOVY 1 MG/0.5ML ~~LOC~~ SOAJ: 1.0000 mg | SUBCUTANEOUS | 2 refills | Status: DC

## 2023-03-06 MED ORDER — UBRELVY 100 MG PO TABS: 100.0000 mg | ORAL_TABLET | Freq: Every day | ORAL | 2 refills | Status: DC | PRN

## 2023-03-06 MED ORDER — METOPROLOL SUCCINATE ER 25 MG PO TB24: ORAL_TABLET | ORAL | 1 refills | Status: DC

## 2023-03-06 NOTE — Progress Notes (Signed)
[facility] [address] [place], [place] [address]  Internal MEDICINE  Office Visit Note  Patient Name: Barbara Stephens  [id number]  [id number]  Date of Service: 03/06/2023  Chief Complaint  Patient presents with   Hypertension   Follow-up    HPI Antara presents for a follow-up visit for menopause, migraines, obesity, and hypertension Allyne Gee is covered and has been helping [place] PA was denied, even though it was covered previously, insurance denied the renewal and also denied [place].  Obesity -- BMI 34 with hypertension  Hypertension --  controlled with metoprolol.    Current Medication: Outpatient Encounter Medications as of 03/06/2023  Medication Sig   Atogepant (QULIPTA) 60 MG TABS Take 1 tablet (60 mg total) by mouth daily.   zolmitriptan (ZOMIG-ZMT) 5 MG disintegrating tablet Take 1 tablet (5 mg total) by mouth as needed for migraine.   [DISCONTINUED] Fezolinetant (VEOZAH) 45 MG TABS Take 1 tablet (45 mg total) by mouth daily.   [DISCONTINUED] metoprolol succinate (TOPROL-XL) 25 MG 24 hr tablet TAKE 1 TABLET(25 MG) BY MOUTH DAILY   [DISCONTINUED] Semaglutide-Weight Management (WEGOVY) 1 MG/0.5ML SOAJ Inject 1 mg into the skin once a week.   [DISCONTINUED] Ubrogepant (UBRELVY) 100 MG TABS Take 1 tablet (100 mg total) by mouth daily as needed (acute migraine).   Fezolinetant (VEOZAH) 45 MG TABS Take 1 tablet (45 mg total) by mouth daily.   metoprolol succinate (TOPROL-XL) 25 MG 24 hr tablet TAKE 1 TABLET(25 MG) BY MOUTH DAILY   Semaglutide-Weight Management (WEGOVY) 1 MG/0.5ML SOAJ Inject 1 mg into the skin once a week.   Ubrogepant (UBRELVY) 100 MG TABS Take 1 tablet (100 mg total) by mouth daily as needed (acute migraine).   No facility-administered encounter medications on file as of 03/06/2023.    Surgical History: Past Surgical History:  Procedure Laterality Date   CESAREAN SECTION     COLONOSCOPY WITH PROPOFOL N/A 01/12/2021   Procedure:  COLONOSCOPY WITH PROPOFOL;  Surgeon: Wyline Mood, MD;  Location: [facility] ENDOSCOPY;  Service: Gastroenterology;  Laterality: N/A;    Medical History: Past Medical History:  Diagnosis Date   Hypertension    Migraines    Premenopausal patient     Family History: Family History  Problem Relation Age of Onset   Thyroid disease Mother    Heart disease Mother    Lung cancer Father    Diabetes Father    Breast cancer Neg Hx     Social History   Socioeconomic History   Marital status: Divorced    Spouse name: Not on file   Number of children: Not on file   Years of education: Not on file   Highest education level: Not on file  Occupational History   Not on file  Tobacco Use   Smoking status: Former    Types: Cigarettes   Smokeless tobacco: Never  Vaping Use   Vaping status: Former  Substance and Sexual Activity   Alcohol use: Yes    Comment: occasionally   Drug use: Never   Sexual activity: Not on file  Other Topics Concern   Not on file  Social History Narrative   Not on file   Social Determinants of Health   Financial Resource Strain: Not on file  Food Insecurity: Not on file  Transportation Needs: Not on file  Physical Activity: Not on file  Stress: Not on file  Social Connections: Unknown (08/14/2021)   Received [facility], [facility]   Social Network  Social Network: Not on file  Intimate Partner Violence: Unknown (08/14/2021)   Received from [facility], [facility]   HITS    Physically Hurt: Not on file    Insult or Talk Down To: Not on file    Threaten Physical Harm: Not on file    Scream or Curse: Not on file      Review of Systems  Constitutional:  Positive for unexpected weight change. Negative for activity change, appetite change, chills, fatigue and fever.  HENT: Negative.  Negative for congestion, ear pain, rhinorrhea, sore throat and trouble swallowing.        Sensitivity to sound associated with migraines  Eyes:  Positive for  photophobia (associated with migraines).  Respiratory: Negative.  Negative for cough, chest tightness, shortness of breath and wheezing.   Cardiovascular: Negative.  Negative for chest pain.  Gastrointestinal:  Positive for nausea (associated with migraines). Negative for abdominal pain, blood in stool, constipation, diarrhea and vomiting.  Endocrine: Negative.   Genitourinary: Negative.  Negative for difficulty urinating, dysuria, frequency, hematuria and urgency.  Musculoskeletal: Negative.  Negative for arthralgias, back pain, joint swelling, myalgias and neck pain.  Skin: Negative.  Negative for rash and wound.  Allergic/Immunologic: Negative.  Negative for immunocompromised state.  Neurological:  Positive for dizziness (associated with migraines) and headaches. Negative for seizures and numbness.  Hematological: Negative.   Psychiatric/Behavioral: Negative.  Negative for behavioral problems, self-injury and suicidal ideas. The patient is not nervous/anxious.     Vital Signs: BP 128/80   Pulse 75   Temp 98.6 F (37 C)   Resp 16   Ht 5\' 5"  (1.651 m)   Wt 208 lb 9.6 oz (94.6 kg)   SpO2 96%   BMI 34.71 kg/m    Physical Exam Vitals reviewed.  Constitutional:      General: She is not in acute distress.    Appearance: Normal appearance. She is obese. She is not ill-appearing.  HENT:     Head: Normocephalic and atraumatic.  Eyes:     Pupils: Pupils are equal, round, and reactive to light.  Cardiovascular:     Rate and Rhythm: Normal rate and regular rhythm.  Pulmonary:     Effort: Pulmonary effort is normal. No respiratory distress.  Neurological:     Mental Status: She is alert and oriented to person, place, and time.  Psychiatric:        Mood and Affect: Mood normal.        Behavior: Behavior normal.        Assessment/Plan: 1. Intractable chronic migraine with aura and without status migrainosus Will appeal qulipta denial, samples given to patient. Bernita Raisin refills  ordered and will need renewal of prior authorization.  - Ubrogepant (UBRELVY) 100 MG TABS; Take 1 tablet (100 mg total) by mouth daily as needed (acute migraine).  Dispense: 16 tablet; Refill: 2  2. Essential hypertension Stable, continue metoprolol as prescribed.  - metoprolol succinate (TOPROL-XL) 25 MG 24 hr tablet; TAKE 1 TABLET(25 MG) BY MOUTH DAILY  Dispense: 90 tablet; Refill: 1  3. Hot flashes due to menopause Continue veozah as prescribed.  - Fezolinetant (VEOZAH) 45 MG TABS; Take 1 tablet (45 mg total) by mouth daily.  Dispense: 30 tablet; Refill: 3 - Cortisol-am, blood  4. Class 1 obesity due to excess calories with serious comorbidity and body mass index (BMI) of 34.0 to 34.9 in adult [facility] prescribed, cortisol level ordered per patient request.  - Semaglutide-Weight Management (WEGOVY) 1 MG/0.5ML SOAJ; Inject  1 mg into the skin once a week.  Dispense: 2 mL; Refill: 2 - Cortisol-am, blood  5. At increased risk for cardiovascular disease [facility] prescription resent, will attempt to get approved.  - Semaglutide-Weight Management (WEGOVY) 1 MG/0.5ML SOAJ; Inject 1 mg into the skin once a week.  Dispense: 2 mL; Refill: 2   General Counseling: Mohini verbalizes understanding of the findings of todays visit and agrees with plan of treatment. I have discussed any further diagnostic evaluation that may be needed or ordered today. We also reviewed her medications today. she has been encouraged to call the office with any questions or concerns that should arise related to todays visit.    Orders Placed This Encounter  Procedures   Cortisol-am, blood    Meds ordered this encounter  Medications   Semaglutide-Weight Management (WEGOVY) 1 MG/0.5ML SOAJ    Sig: Inject 1 mg into the skin once a week.    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 PLEASE SEND PRIOR AUTH REQUEST ASAP SO WE CAN COMPLETE IT. FAX # [phone]   Ubrogepant (UBRELVY) 100 MG TABS    Sig: Take 1  tablet (100 mg total) by mouth daily as needed (acute migraine).    Dispense:  16 tablet    Refill:  2    Please send new prior auth request for renewal   metoprolol succinate (TOPROL-XL) 25 MG 24 hr tablet    Sig: TAKE 1 TABLET(25 MG) BY MOUTH DAILY    Dispense:  90 tablet    Refill:  1   Fezolinetant (VEOZAH) 45 MG TABS    Sig: Take 1 tablet (45 mg total) by mouth daily.    Dispense:  30 tablet    Refill:  3    Return in about 2 months (around 05/06/2023) for F/U, Reeta Kuk PCP migraines and weight loss .   Total time spent:30 Minutes Time spent includes review of chart, medications, test results, and follow up plan with the patient.    Controlled Substance Database was reviewed by me.  This patient was seen by Sallyanne Kuster, FNP-C in collaboration with Dr. Beverely Risen as a part of collaborative care agreement.   Praise Stennett R. Tedd Sias, MSN, FNP-C Internal medicine

## 2023-03-14 ENCOUNTER — Telehealth: Payer: Self-pay

## 2023-03-14 NOTE — Telephone Encounter (Signed)
PA was done and approved for Ubrelvy.  

## 2023-04-16 ENCOUNTER — Other Ambulatory Visit: Payer: Self-pay | Admitting: Nurse Practitioner

## 2023-04-16 ENCOUNTER — Other Ambulatory Visit: Payer: Self-pay

## 2023-04-16 DIAGNOSIS — N951 Menopausal and female climacteric states: Secondary | ICD-10-CM

## 2023-04-16 MED ORDER — VEOZAH 45 MG PO TABS: 1.0000 | ORAL_TABLET | Freq: Every day | ORAL | 3 refills | Status: DC

## 2023-05-06 ENCOUNTER — Ambulatory Visit: Payer: Managed Care, Other (non HMO) | Admitting: Nurse Practitioner

## 2023-06-15 ENCOUNTER — Other Ambulatory Visit: Payer: Self-pay | Admitting: Nurse Practitioner

## 2023-06-15 DIAGNOSIS — I10 Essential (primary) hypertension: Secondary | ICD-10-CM

## 2023-09-05 ENCOUNTER — Encounter: Payer: Self-pay | Admitting: Nurse Practitioner

## 2023-09-05 ENCOUNTER — Ambulatory Visit (INDEPENDENT_AMBULATORY_CARE_PROVIDER_SITE_OTHER): Admitting: Nurse Practitioner

## 2023-09-05 VITALS — BP 134/86 | HR 89 | Temp 98.3°F | Resp 16 | Ht 65.0 in | Wt 215.4 lb

## 2023-09-05 DIAGNOSIS — E782 Mixed hyperlipidemia: Secondary | ICD-10-CM

## 2023-09-05 DIAGNOSIS — N951 Menopausal and female climacteric states: Secondary | ICD-10-CM

## 2023-09-05 DIAGNOSIS — G43E19 Chronic migraine with aura, intractable, without status migrainosus: Secondary | ICD-10-CM

## 2023-09-05 DIAGNOSIS — D352 Benign neoplasm of pituitary gland: Secondary | ICD-10-CM

## 2023-09-05 DIAGNOSIS — R7301 Impaired fasting glucose: Secondary | ICD-10-CM

## 2023-09-05 DIAGNOSIS — Z6835 Body mass index (BMI) 35.0-35.9, adult: Secondary | ICD-10-CM

## 2023-09-05 DIAGNOSIS — E538 Deficiency of other specified B group vitamins: Secondary | ICD-10-CM

## 2023-09-05 DIAGNOSIS — E559 Vitamin D deficiency, unspecified: Secondary | ICD-10-CM

## 2023-09-05 DIAGNOSIS — I10 Essential (primary) hypertension: Secondary | ICD-10-CM

## 2023-09-05 DIAGNOSIS — E66812 Obesity, class 2: Secondary | ICD-10-CM

## 2023-09-05 DIAGNOSIS — Z9189 Other specified personal risk factors, not elsewhere classified: Secondary | ICD-10-CM

## 2023-09-05 MED ORDER — UBRELVY 100 MG PO TABS: 100.0000 mg | ORAL_TABLET | Freq: Every day | ORAL | 2 refills | Status: DC | PRN

## 2023-09-05 MED ORDER — WEGOVY 0.5 MG/0.5ML ~~LOC~~ SOAJ: 0.5000 mg | SUBCUTANEOUS | 2 refills | Status: DC

## 2023-09-05 MED ORDER — ZOLMITRIPTAN 5 MG PO TBDP: 5.0000 mg | ORAL_TABLET | ORAL | 2 refills | Status: DC | PRN

## 2023-09-05 MED ORDER — SEMAGLUTIDE-WEIGHT MANAGEMENT 0.25 MG/0.5ML ~~LOC~~ SOAJ: 0.2500 mg | SUBCUTANEOUS | 0 refills | Status: DC

## 2023-09-05 MED ORDER — METOPROLOL SUCCINATE ER 25 MG PO TB24: ORAL_TABLET | ORAL | 1 refills | Status: DC

## 2023-09-05 MED ORDER — QULIPTA 60 MG PO TABS: 60.0000 mg | ORAL_TABLET | Freq: Every day | ORAL | 11 refills | Status: DC

## 2023-09-05 NOTE — Progress Notes (Signed)
 [facility] [address] [place], [place] [address]  Internal MEDICINE  Office Visit Note  Patient Name: Barbara Stephens  [id number]  [id number]  Date of Service: 09/05/2023  Chief Complaint  Patient presents with   Hypertension   Follow-up    HPI Barbara Stephens presents for a follow-up visit for weight loss, labs, perimenopause and migraines. Perimenopausal symptoms -- brain fog, fatigue, trouble sleeping, hot flashes, night sweats, mood swings and weight gain Weight gain -- has been working on weight loss with diet and exercise for at least 6 months now and has not lost any significant amount of weight.  Due for routine labs  Due for mammogram -- patient will call Due for colonoscopy in October.     Current Medication: Outpatient Encounter Medications as of 09/05/2023  Medication Sig   Fezolinetant  (VEOZAH ) 45 MG TABS Take 1 tablet (45 mg total) by mouth daily.   [START ON 09/26/2023] Semaglutide -Weight Management (WEGOVY ) 0.5 MG/0.5ML SOAJ Inject 0.5 mg into the skin once a week.   Semaglutide -Weight Management (WEGOVY ) 1 MG/0.5ML SOAJ Inject 1 mg into the skin once a week.   Semaglutide -Weight Management 0.25 MG/0.5ML SOAJ Inject 0.25 mg into the skin once a week.   [DISCONTINUED] Atogepant  (QULIPTA ) 60 MG TABS Take 1 tablet (60 mg total) by mouth daily.   [DISCONTINUED] metoprolol  succinate (TOPROL -XL) 25 MG 24 hr tablet TAKE 1 TABLET(25 MG) BY MOUTH DAILY   [DISCONTINUED] Ubrogepant  (UBRELVY ) 100 MG TABS Take 1 tablet (100 mg total) by mouth daily as needed (acute migraine).   [DISCONTINUED] zolmitriptan  (ZOMIG -ZMT) 5 MG disintegrating tablet Take 1 tablet (5 mg total) by mouth as needed for migraine.   Atogepant  (QULIPTA ) 60 MG TABS Take 1 tablet (60 mg total) by mouth daily.   metoprolol  succinate (TOPROL -XL) 25 MG 24 hr tablet TAKE 1 TABLET(25 MG) BY MOUTH DAILY   Ubrogepant  (UBRELVY ) 100 MG TABS Take 1 tablet (100 mg total) by mouth daily as needed (acute migraine).    zolmitriptan  (ZOMIG -ZMT) 5 MG disintegrating tablet Take 1 tablet (5 mg total) by mouth as needed for migraine.   No facility-administered encounter medications on file as of 09/05/2023.    Surgical History: Past Surgical History:  Procedure Laterality Date   CESAREAN SECTION     COLONOSCOPY WITH PROPOFOL  N/A 01/12/2021   Procedure: COLONOSCOPY WITH PROPOFOL ;  Surgeon: Luke Salaam, MD;  Location: [facility] ENDOSCOPY;  Service: Gastroenterology;  Laterality: N/A;    Medical History: Past Medical History:  Diagnosis Date   Hypertension    Migraines    Premenopausal patient     Family History: Family History  Problem Relation Age of Onset   Thyroid disease Mother    Heart disease Mother    Lung cancer Father    Diabetes Father    Breast cancer Neg Hx     Social History   Socioeconomic History   Marital status: Divorced    Spouse name: Not on file   Number of children: Not on file   Years of education: Not on file   Highest education level: Not on file  Occupational History   Not on file  Tobacco Use   Smoking status: Former    Types: Cigarettes   Smokeless tobacco: Never  Vaping Use   Vaping status: Former  Substance and Sexual Activity   Alcohol use: Yes    Comment: occasionally   Drug use: Never   Sexual activity: Not on file  Other Topics Concern   Not on file  Social History Narrative   Not on file   Social Drivers of Health   Financial Resource Strain: Not on file  Food Insecurity: Not on file  Transportation Needs: Not on file  Physical Activity: Not on file  Stress: Not on file  Social Connections: Unknown (08/14/2021)   [facility], [facility]   Social Network    Social Network: Not on file  Intimate Partner Violence: Unknown (08/14/2021)   Received from [facility], [facility]   HITS    Physically Hurt: Not on file    Insult or Talk Down To: Not on file    Threaten Physical Harm: Not on file    Scream or Curse: Not on  file      Review of Systems  Constitutional:  Positive for fatigue and unexpected weight change. Negative for activity change, appetite change, chills and fever.  HENT: Negative.  Negative for congestion, ear pain, rhinorrhea, sore throat and trouble swallowing.        Sensitivity to sound associated with migraines  Eyes:  Positive for photophobia (associated with migraines).  Respiratory: Negative.  Negative for cough, chest tightness, shortness of breath and wheezing.   Cardiovascular: Negative.  Negative for chest pain and palpitations.  Gastrointestinal:  Positive for nausea (associated with migraines). Negative for abdominal pain, blood in stool, constipation, diarrhea and vomiting.  Endocrine: Negative.   Genitourinary: Negative.  Negative for difficulty urinating, dysuria, frequency, hematuria and urgency.  Musculoskeletal:  Positive for arthralgias. Negative for back pain, joint swelling, myalgias and neck pain.  Skin: Negative.  Negative for rash and wound.  Allergic/Immunologic: Negative.  Negative for immunocompromised state.  Neurological:  Positive for dizziness (associated with migraines) and headaches. Negative for seizures and numbness.  Hematological: Negative.   Psychiatric/Behavioral:  Positive for sleep disturbance. Negative for behavioral problems, self-injury and suicidal ideas. The patient is nervous/anxious.     Vital Signs: BP 134/86   Pulse 89   Temp 98.3 F (36.8 C)   Resp 16   Ht 5\' 5"  (1.651 m)   Wt 215 lb 6.4 oz (97.7 kg)   SpO2 96%   BMI 35.84 kg/m    Physical Exam Vitals reviewed.  Constitutional:      General: She is not in acute distress.    Appearance: Normal appearance. She is obese. She is not ill-appearing.  HENT:     Head: Normocephalic and atraumatic.  Eyes:     Pupils: Pupils are equal, round, and reactive to light.  Cardiovascular:     Rate and Rhythm: Normal rate and regular rhythm.  Pulmonary:     Effort: Pulmonary effort is  normal. No respiratory distress.  Neurological:     Mental Status: She is alert and oriented to person, place, and time.  Psychiatric:        Mood and Affect: Mood normal.        Behavior: Behavior normal.        Assessment/Plan: 1. Essential hypertension (Primary) Stable, continue metoprolol  as prescribed.  - metoprolol  succinate (TOPROL -XL) 25 MG 24 hr tablet; TAKE 1 TABLET(25 MG) BY MOUTH DAILY  Dispense: 90 tablet; Refill: 1  2. Pituitary adenoma (HCC) Routine labs ordered  - CBC with Differential/Platelet - CMP14+EGFR - Lipid Profile - Hgb A1C w/o eAG - B12 and Folate Panel - Vitamin D (25 hydroxy) - Cortisol-am, blood - Prolactin - TSH + free T4  3. Mixed hyperlipidemia Routine labs ordered  - CBC with Differential/Platelet - CMP14+EGFR - Lipid  Profile - Hgb A1C w/o eAG - TSH + free T4  4. Intractable chronic migraine with aura and without status migrainosus Continue qulipta , ubrelvy  and zolmitriptan  as prescribed. Routine labs ordered  - Atogepant  (QULIPTA ) 60 MG TABS; Take 1 tablet (60 mg total) by mouth daily.  Dispense: 30 tablet; Refill: 11 - zolmitriptan  (ZOMIG -ZMT) 5 MG disintegrating tablet; Take 1 tablet (5 mg total) by mouth as needed for migraine.  Dispense: 10 tablet; Refill: 2 - Ubrogepant  (UBRELVY ) 100 MG TABS; Take 1 tablet (100 mg total) by mouth daily as needed (acute migraine).  Dispense: 16 tablet; Refill: 2 - Cortisol-am, blood - Prolactin - TSH + free T4  5. Perimenopause Routine labs ordered  - CBC with Differential/Platelet - CMP14+EGFR - Lipid Profile - Hgb A1C w/o eAG - B12 and Folate Panel - Vitamin D (25 hydroxy) - Cortisol-am, blood - Prolactin - TSH + free T4  6. Impaired fasting glucose Routine labs ordered  - Lipid Profile - Hgb A1C w/o eAG - Cortisol-am, blood - Prolactin - TSH + free T4  7. B12 deficiency Routine labs ordered  - CBC with Differential/Platelet - B12 and Folate Panel  8. Vitamin D  deficiency Routine lab ordered  - Vitamin D (25 hydroxy)  9. Class 2 severe obesity due to excess calories with serious comorbidity and body mass index (BMI) of 35.0 to 35.9 in adult [facility]) Will try to see if wegovy  is covered by her insurance.  - Semaglutide -Weight Management 0.25 MG/0.5ML SOAJ; Inject 0.25 mg into the skin once a week.  Dispense: 2 mL; Refill: 0 - Semaglutide -Weight Management (WEGOVY ) 0.5 MG/0.5ML SOAJ; Inject 0.5 mg into the skin once a week.  Dispense: 2 mL; Refill: 2  10. At increased risk for cardiovascular disease Routine labs ordered  - CBC with Differential/Platelet - CMP14+EGFR - Lipid Profile - Hgb A1C w/o eAG - Cortisol-am, blood - Prolactin - TSH + free T4   General Counseling: Cyani verbalizes understanding of the findings of todays visit and agrees with plan of treatment. I have discussed any further diagnostic evaluation that may be needed or ordered today. We also reviewed her medications today. she has been encouraged to call the office with any questions or concerns that should arise related to todays visit.    Orders Placed This Encounter  Procedures   CBC with Differential/Platelet   CMP14+EGFR   Lipid Profile   Hgb A1C w/o eAG   B12 and Folate Panel   Vitamin D (25 hydroxy)   Cortisol-am, blood   Prolactin   TSH + free T4    Meds ordered this encounter  Medications   Atogepant  (QULIPTA ) 60 MG TABS    Sig: Take 1 tablet (60 mg total) by mouth daily.    Dispense:  30 tablet    Refill:  11    SEND PRIOR AUTHORIZATION ASAP TO FAX # [phone]   zolmitriptan  (ZOMIG -ZMT) 5 MG disintegrating tablet    Sig: Take 1 tablet (5 mg total) by mouth as needed for migraine.    Dispense:  10 tablet    Refill:  2   Ubrogepant  (UBRELVY ) 100 MG TABS    Sig: Take 1 tablet (100 mg total) by mouth daily as needed (acute migraine).    Dispense:  16 tablet    Refill:  2    Please send new prior auth request for renewal   metoprolol  succinate  (TOPROL -XL) 25 MG 24 hr tablet    Sig: TAKE 1 TABLET(25 MG) BY MOUTH  DAILY    Dispense:  90 tablet    Refill:  1    For future refills   Semaglutide -Weight Management 0.25 MG/0.5ML SOAJ    Sig: Inject 0.25 mg into the skin once a week.    Dispense:  2 mL    Refill:  0    Dx code E66.01, Please send prior auth request asap. Thanks   Semaglutide -Weight Management (WEGOVY ) 0.5 MG/0.5ML SOAJ    Sig: Inject 0.5 mg into the skin once a week.    Dispense:  2 mL    Refill:  2    Dx code E66.01, this script is to be filled after 4 doses of the 0.25 mg dose    Return in about 4 weeks (around 10/03/2023) for F/U, Labs, Nayson Traweek PCP and eval new med and weight loss .   Total time spent:30 Minutes Time spent includes review of chart, medications, test results, and follow up plan with the patient.   [place] Controlled Substance Database was reviewed by me.  This patient was seen by Laurence Pons, FNP-C in collaboration with Dr. Verneta Gone as a part of collaborative care agreement.   Jaxtyn Linville R. Bobbi Burow, MSN, FNP-C Internal medicine

## 2023-09-08 ENCOUNTER — Encounter: Payer: Self-pay | Admitting: Nurse Practitioner

## 2023-09-24 LAB — CBC WITH DIFFERENTIAL/PLATELET
Basophils Absolute: 0.1 10*3/uL (ref 0.0–0.2)
Basos: 1 %
EOS (ABSOLUTE): 0.5 10*3/uL — ABNORMAL HIGH (ref 0.0–0.4)
Eos: 7 %
Hematocrit: 43 % (ref 34.0–46.6)
Hemoglobin: 14.4 g/dL (ref 11.1–15.9)
Immature Grans (Abs): 0 10*3/uL (ref 0.0–0.1)
Immature Granulocytes: 0 %
Lymphocytes Absolute: 2.6 10*3/uL (ref 0.7–3.1)
Lymphs: 33 %
MCH: 31.2 pg (ref 26.6–33.0)
MCHC: 33.5 g/dL (ref 31.5–35.7)
MCV: 93 fL (ref 79–97)
Monocytes Absolute: 0.7 10*3/uL (ref 0.1–0.9)
Monocytes: 8 %
Neutrophils Absolute: 4.1 10*3/uL (ref 1.4–7.0)
Neutrophils: 51 %
Platelets: 355 10*3/uL (ref 150–450)
RBC: 4.61 x10E6/uL (ref 3.77–5.28)
RDW: 12.5 % (ref 11.7–15.4)
WBC: 8.1 10*3/uL (ref 3.4–10.8)

## 2023-09-24 LAB — CMP14+EGFR
ALT: 15 IU/L (ref 0–32)
AST: 18 IU/L (ref 0–40)
Albumin: 4.6 g/dL (ref 3.9–4.9)
Alkaline Phosphatase: 67 IU/L (ref 44–121)
BUN/Creatinine Ratio: 17 (ref 9–23)
BUN: 12 mg/dL (ref 6–24)
Bilirubin Total: 0.5 mg/dL (ref 0.0–1.2)
CO2: 21 mmol/L (ref 20–29)
Calcium: 9.4 mg/dL (ref 8.7–10.2)
Chloride: 104 mmol/L (ref 96–106)
Creatinine, Ser: 0.69 mg/dL (ref 0.57–1.00)
Globulin, Total: 2.3 g/dL (ref 1.5–4.5)
Glucose: 92 mg/dL (ref 70–99)
Potassium: 4.6 mmol/L (ref 3.5–5.2)
Sodium: 142 mmol/L (ref 134–144)
Total Protein: 6.9 g/dL (ref 6.0–8.5)
eGFR: 107 mL/min/{1.73_m2} (ref >59)

## 2023-09-24 LAB — B12 AND FOLATE PANEL
Folate: 10.2 ng/mL (ref >3.0)
Vitamin B-12: 410 pg/mL (ref 232–1245)

## 2023-09-24 LAB — VITAMIN D 25 HYDROXY (VIT D DEFICIENCY, FRACTURES): Vit D, 25-Hydroxy: 46.8 ng/mL (ref 30.0–100.0)

## 2023-09-24 LAB — LIPID PANEL
Chol/HDL Ratio: 4.8 ratio — ABNORMAL HIGH (ref 0.0–4.4)
Cholesterol, Total: 202 mg/dL — ABNORMAL HIGH (ref 100–199)
HDL: 42 mg/dL (ref >39)
LDL Chol Calc (NIH): 138 mg/dL — ABNORMAL HIGH (ref 0–99)
Triglycerides: 123 mg/dL (ref 0–149)
VLDL Cholesterol Cal: 22 mg/dL (ref 5–40)

## 2023-09-24 LAB — PROLACTIN: Prolactin: 39.7 ng/mL — ABNORMAL HIGH (ref 4.8–33.4)

## 2023-09-24 LAB — TSH+FREE T4
Free T4: 1.18 ng/dL (ref 0.82–1.77)
TSH: 2.3 u[IU]/mL (ref 0.450–4.500)

## 2023-09-24 LAB — HGB A1C W/O EAG: Hgb A1c MFr Bld: 5.5 % (ref 4.8–5.6)

## 2023-09-24 LAB — CORTISOL-AM, BLOOD: Cortisol - AM: 17.2 ug/dL (ref 6.2–19.4)

## 2023-10-02 ENCOUNTER — Ambulatory Visit: Admitting: Nurse Practitioner

## 2023-10-02 ENCOUNTER — Encounter: Payer: Self-pay | Admitting: Nurse Practitioner

## 2023-10-02 VITALS — BP 130/84 | HR 98 | Temp 98.4°F | Resp 16 | Ht 65.0 in | Wt 206.4 lb

## 2023-10-02 DIAGNOSIS — E66811 Obesity, class 1: Secondary | ICD-10-CM | POA: Diagnosis not present

## 2023-10-02 DIAGNOSIS — E6609 Other obesity due to excess calories: Secondary | ICD-10-CM

## 2023-10-02 DIAGNOSIS — D352 Benign neoplasm of pituitary gland: Secondary | ICD-10-CM | POA: Diagnosis not present

## 2023-10-02 DIAGNOSIS — N951 Menopausal and female climacteric states: Secondary | ICD-10-CM

## 2023-10-02 DIAGNOSIS — G43E19 Chronic migraine with aura, intractable, without status migrainosus: Secondary | ICD-10-CM

## 2023-10-02 DIAGNOSIS — Z9189 Other specified personal risk factors, not elsewhere classified: Secondary | ICD-10-CM

## 2023-10-02 DIAGNOSIS — Z6834 Body mass index (BMI) 34.0-34.9, adult: Secondary | ICD-10-CM

## 2023-10-02 MED ORDER — NURTEC 75 MG PO TBDP: 75.0000 mg | ORAL_TABLET | ORAL | 11 refills | Status: DC

## 2023-10-02 NOTE — Progress Notes (Signed)
 [facility] [address] [place], [place] [address]  Internal MEDICINE  Office Visit Note  Patient Name: Barbara Stephens  [id number]  [id number]  Date of Service: 10/02/2023  Chief Complaint  Patient presents with   Hypertension   Follow-up    Eval new med and weight loss     HPI Barbara Stephens presents for a follow-up visit for migraines, weight loss and lab results.  Weight loss -- down 9 lbs since last office visit. Wegovy  was approved by her insurance. She is going to start the 0.5 mg dose for her next shot.  Migraines -- qulipta  was not approved.  Reviewed labs with patient. -- cholesterol is slightly improved but still elevated.  Prolactin remains elevated but improved from prior level.  All other labs are normal.    Current Medication: Outpatient Encounter Medications as of 10/02/2023  Medication Sig   Rimegepant Sulfate (NURTEC) 75 MG TBDP Take 1 tablet (75 mg total) by mouth every other day.   Fezolinetant  (VEOZAH ) 45 MG TABS Take 1 tablet (45 mg total) by mouth daily.   metoprolol  succinate (TOPROL -XL) 25 MG 24 hr tablet TAKE 1 TABLET(25 MG) BY MOUTH DAILY   Semaglutide -Weight Management (WEGOVY ) 0.5 MG/0.5ML SOAJ Inject 0.5 mg into the skin once a week.   Semaglutide -Weight Management (WEGOVY ) 1 MG/0.5ML SOAJ Inject 1 mg into the skin once a week.   Semaglutide -Weight Management 0.25 MG/0.5ML SOAJ Inject 0.25 mg into the skin once a week.   Ubrogepant  (UBRELVY ) 100 MG TABS Take 1 tablet (100 mg total) by mouth daily as needed (acute migraine).   zolmitriptan  (ZOMIG -ZMT) 5 MG disintegrating tablet Take 1 tablet (5 mg total) by mouth as needed for migraine.   [DISCONTINUED] Atogepant  (QULIPTA ) 60 MG TABS Take 1 tablet (60 mg total) by mouth daily.   No facility-administered encounter medications on file as of 10/02/2023.    Surgical History: Past Surgical History:  Procedure Laterality Date   CESAREAN SECTION     COLONOSCOPY WITH PROPOFOL  N/A 01/12/2021    Procedure: COLONOSCOPY WITH PROPOFOL ;  Surgeon: Therisa Bi, MD;  Location: [facility] ENDOSCOPY;  Service: Gastroenterology;  Laterality: N/A;    Medical History: Past Medical History:  Diagnosis Date   Hypertension    Migraines    Premenopausal patient     Family History: Family History  Problem Relation Age of Onset   Thyroid disease Mother    Heart disease Mother    Lung cancer Father    Diabetes Father    Breast cancer Neg Hx     Social History   Socioeconomic History   Marital status: Divorced    Spouse name: Not on file   Number of children: Not on file   Years of education: Not on file   Highest education level: Not on file  Occupational History   Not on file  Tobacco Use   Smoking status: Former    Types: Cigarettes   Smokeless tobacco: Never  Vaping Use   Vaping status: Former  Substance and Sexual Activity   Alcohol use: Yes    Comment: occasionally   Drug use: Never   Sexual activity: Not on file  Other Topics Concern   Not on file  Social History Narrative   Not on file   Social Drivers of Health   Financial Resource Strain: Not on file  Food Insecurity: Not on file  Transportation Needs: Not on file  Physical Activity: Not on file  Stress: Not on file  Social Connections:  Unknown (08/14/2021)   Received from [facility]   Social Network    Social Network: Not on file  Intimate Partner Violence: Unknown (08/14/2021)   Received from [facility]   HITS    Physically Hurt: Not on file    Insult or Talk Down To: Not on file    Threaten Physical Harm: Not on file    Scream or Curse: Not on file      Review of Systems  Constitutional:  Positive for fatigue and unexpected weight change. Negative for activity change, appetite change, chills and fever.  HENT: Negative.  Negative for congestion, ear pain, rhinorrhea, sore throat and trouble swallowing.        Sensitivity to sound associated with migraines  Eyes:  Positive for photophobia  (associated with migraines).  Respiratory: Negative.  Negative for cough, chest tightness, shortness of breath and wheezing.   Cardiovascular: Negative.  Negative for chest pain and palpitations.  Gastrointestinal:  Positive for nausea (associated with migraines). Negative for abdominal pain, blood in stool, constipation, diarrhea and vomiting.  Endocrine: Negative.   Genitourinary: Negative.  Negative for difficulty urinating, dysuria, frequency, hematuria and urgency.  Musculoskeletal:  Positive for arthralgias. Negative for back pain, joint swelling, myalgias and neck pain.  Skin: Negative.  Negative for rash and wound.  Allergic/Immunologic: Negative.  Negative for immunocompromised state.  Neurological:  Positive for dizziness (associated with migraines) and headaches. Negative for seizures and numbness.  Hematological: Negative.   Psychiatric/Behavioral:  Positive for sleep disturbance. Negative for behavioral problems, self-injury and suicidal ideas. The patient is nervous/anxious.     Vital Signs: BP 130/84   Pulse 98   Temp 98.4 F (36.9 C)   Resp 16   Ht 5' 5 (1.651 m)   Wt 206 lb 6.4 oz (93.6 kg) Comment: 208 lbs in office, and 206.4 lbs at home  SpO2 98%   BMI 34.35 kg/m    Physical Exam Vitals reviewed.  Constitutional:      General: She is not in acute distress.    Appearance: Normal appearance. She is obese. She is not ill-appearing.  HENT:     Head: Normocephalic and atraumatic.   Eyes:     Pupils: Pupils are equal, round, and reactive to light.    Cardiovascular:     Rate and Rhythm: Normal rate and regular rhythm.  Pulmonary:     Effort: Pulmonary effort is normal. No respiratory distress.   Neurological:     Mental Status: She is alert and oriented to person, place, and time.   Psychiatric:        Mood and Affect: Mood normal.        Behavior: Behavior normal.        Assessment/Plan: 1. Intractable chronic migraine with aura and without  status migrainosus Samples given, will try nurtec - Rimegepant Sulfate (NURTEC) 75 MG TBDP; Take 1 tablet (75 mg total) by mouth every other day.  Dispense: 15 tablet; Refill: 11  2. Pituitary adenoma (HCC) (Primary) Prolactin level is stable.   3. Perimenopause Tolerable symptoms at this time.   4. Class 1 obesity due to excess calories with serious comorbidity and body mass index (BMI) of 34.0 to 34.9 in adult Continue wegovy  as prescribed.   5. At increased risk for cardiovascular disease Continue wegovy  as prescribed.    General Counseling: Bryar verbalizes understanding of the findings of todays visit and agrees with plan of treatment. I have discussed any further diagnostic evaluation that may be  needed or ordered today. We also reviewed her medications today. she has been encouraged to call the office with any questions or concerns that should arise related to todays visit.    No orders of the defined types were placed in this encounter.   Meds ordered this encounter  Medications   Rimegepant Sulfate (NURTEC) 75 MG TBDP    Sig: Take 1 tablet (75 mg total) by mouth every other day.    Dispense:  15 tablet    Refill:  11    Discontinue qulipta  and start nurtec, please send PA request asap to fax #[phone]    Return in about 3 months (around 01/02/2024) for F/U, Jerry Clyne PCP, Weight loss, eval new med.   Total time spent:30 Minutes Time spent includes review of chart, medications, test results, and follow up plan with the patient.   [place] Controlled Substance Database was reviewed by me.  This patient was seen by Mardy Maxin, FNP-C in collaboration with Dr. Sigrid Bathe as a part of collaborative care agreement.   Jusiah Aguayo R. Maxin, MSN, FNP-C Internal medicine

## 2023-10-13 ENCOUNTER — Encounter: Payer: Self-pay | Admitting: Nurse Practitioner

## 2023-10-16 ENCOUNTER — Other Ambulatory Visit: Payer: Self-pay | Admitting: Nurse Practitioner

## 2023-10-16 DIAGNOSIS — I10 Essential (primary) hypertension: Secondary | ICD-10-CM

## 2023-10-17 ENCOUNTER — Other Ambulatory Visit: Payer: Self-pay | Admitting: Nurse Practitioner

## 2023-10-17 DIAGNOSIS — Z1231 Encounter for screening mammogram for malignant neoplasm of breast: Secondary | ICD-10-CM

## 2023-10-24 ENCOUNTER — Ambulatory Visit
Admission: RE | Admit: 2023-10-24 | Discharge: 2023-10-24 | Disposition: A | Discharge status: Home / Self Care | Source: Ambulatory Visit | Attending: Nurse Practitioner | Admitting: Nurse Practitioner

## 2023-10-24 DIAGNOSIS — Z1231 Encounter for screening mammogram for malignant neoplasm of breast: Secondary | ICD-10-CM | POA: Insufficient documentation

## 2023-12-02 ENCOUNTER — Encounter: Admitting: Advanced Practice Midwife

## 2023-12-14 ENCOUNTER — Other Ambulatory Visit: Payer: Self-pay | Admitting: Nurse Practitioner

## 2023-12-14 DIAGNOSIS — G43E19 Chronic migraine with aura, intractable, without status migrainosus: Secondary | ICD-10-CM

## 2024-01-02 ENCOUNTER — Ambulatory Visit: Admitting: Nurse Practitioner

## 2024-01-02 ENCOUNTER — Encounter: Payer: Self-pay | Admitting: Nurse Practitioner

## 2024-01-02 VITALS — BP 134/86 | HR 79 | Temp 98.5°F | Resp 16 | Ht 65.0 in | Wt 201.6 lb

## 2024-01-02 DIAGNOSIS — E66811 Obesity, class 1: Secondary | ICD-10-CM

## 2024-01-02 DIAGNOSIS — G43E19 Chronic migraine with aura, intractable, without status migrainosus: Secondary | ICD-10-CM

## 2024-01-02 DIAGNOSIS — E6609 Other obesity due to excess calories: Secondary | ICD-10-CM | POA: Diagnosis not present

## 2024-01-02 DIAGNOSIS — I10 Essential (primary) hypertension: Secondary | ICD-10-CM | POA: Diagnosis not present

## 2024-01-02 DIAGNOSIS — Z6833 Body mass index (BMI) 33.0-33.9, adult: Secondary | ICD-10-CM

## 2024-01-02 MED ORDER — NURTEC 75 MG PO TBDP: 75.0000 mg | ORAL_TABLET | ORAL | 11 refills | Status: AC

## 2024-01-02 MED ORDER — METOPROLOL SUCCINATE ER 25 MG PO TB24: ORAL_TABLET | ORAL | 1 refills | Status: AC

## 2024-01-02 MED ORDER — WEGOVY 1.7 MG/0.75ML ~~LOC~~ SOAJ
1.7000 mg | SUBCUTANEOUS | 5 refills | Status: AC
Start: 2024-01-02 — End: ?

## 2024-01-02 NOTE — Progress Notes (Signed)
 [facility] [address] [place], [place] [address]  Internal MEDICINE  Office Visit Note  Patient Name: Barbara Stephens  [id number]  [id number]  Date of Service: 01/02/2024  Chief Complaint  Patient presents with   Hypertension   Follow-up    HPI Barbara Stephens presents for a follow-up visit for weight loss, migraines and hypertension.  Weight loss -- down to 200.2 lbs on home scale. Has lost about 6 more lbs. Wants to go up on the dose of wegovy .  Hypertension -- controlled with metoprolol .  Migraines -- tried nurtec samples which worked very well. She states that her insurance denied the prior authorization though. She previously tried qulipta  which also worked well but her insurance would not cover the medication. She has also tried aimovig  and emgality . She does take ubrelvy      Current Medication: Outpatient Encounter Medications as of 01/02/2024  Medication Sig   semaglutide -weight management (WEGOVY ) 1.7 MG/0.75ML SOAJ SQ injection Inject 1.7 mg into the skin once a week.   metoprolol  succinate (TOPROL -XL) 25 MG 24 hr tablet TAKE 1 TABLET(25 MG) BY MOUTH DAILY   Rimegepant Sulfate (NURTEC) 75 MG TBDP Take 1 tablet (75 mg total) by mouth every other day.   UBRELVY  100 MG TABS TAKE 1 TABLET(100 MG) BY MOUTH DAILY AS NEEDED FOR ACUTE MIGRAINE   zolmitriptan  (ZOMIG -ZMT) 5 MG disintegrating tablet Take 1 tablet (5 mg total) by mouth as needed for migraine.   [DISCONTINUED] metoprolol  succinate (TOPROL -XL) 25 MG 24 hr tablet TAKE 1 TABLET(25 MG) BY MOUTH DAILY   [DISCONTINUED] Rimegepant Sulfate (NURTEC) 75 MG TBDP Take 1 tablet (75 mg total) by mouth every other day.   [DISCONTINUED] Semaglutide -Weight Management (WEGOVY ) 0.5 MG/0.5ML SOAJ Inject 0.5 mg into the skin once a week.   [DISCONTINUED] Semaglutide -Weight Management (WEGOVY ) 1 MG/0.5ML SOAJ Inject 1 mg into the skin once a week.   [DISCONTINUED] Semaglutide -Weight Management 0.25 MG/0.5ML SOAJ Inject 0.25 mg into  the skin once a week.   No facility-administered encounter medications on file as of 01/02/2024.    Surgical History: Past Surgical History:  Procedure Laterality Date   CESAREAN SECTION     COLONOSCOPY WITH PROPOFOL  N/A 01/12/2021   Procedure: COLONOSCOPY WITH PROPOFOL ;  Surgeon: Therisa Bi, MD;  Location: [facility] ENDOSCOPY;  Service: Gastroenterology;  Laterality: N/A;    Medical History: Past Medical History:  Diagnosis Date   Hypertension    Migraines    Premenopausal patient     Family History: Family History  Problem Relation Age of Onset   Thyroid disease Mother    Heart disease Mother    Lung cancer Father    Diabetes Father    Breast cancer Neg Hx     Social History   Socioeconomic History   Marital status: Divorced    Spouse name: Not on file   Number of children: Not on file   Years of education: Not on file   Highest education level: Not on file  Occupational History   Not on file  Tobacco Use   Smoking status: Former    Types: Cigarettes   Smokeless tobacco: Never  Vaping Use   Vaping status: Former  Substance and Sexual Activity   Alcohol use: Yes    Comment: occasionally   Drug use: Never   Sexual activity: Not on file  Other Topics Concern   Not on file  Social History Narrative   Not on file   Social Drivers of Health   Financial Resource Strain:  Low Risk  (12/13/2023)   Received from [facility]   Overall Financial Resource Strain (CARDIA)    Difficulty of Paying Living Expenses: Not hard at all  Food Insecurity: No Food Insecurity (12/13/2023)   Received from [facility]   Hunger Vital Sign    Within the past 12 months, you worried that your food would run out before you got the money to buy more.: Never true    Within the past 12 months, the food you bought just didn't last and you didn't have money to get more.: Never true  Transportation Needs: No Transportation Needs (12/13/2023)   Received from [facility] - Transportation    In the past 12 months, has lack of transportation kept you from medical appointments or from getting medications?: No    Lack of Transportation (Non-Medical): No  Physical Activity: Not on file  Stress: Not on file  Social Connections: Unknown (08/14/2021)   Received from [facility]   Social Network    Social Network: Not on file  Intimate Partner Violence: Unknown (08/14/2021)   Received from [facility]   HITS    Physically Hurt: Not on file    Insult or Talk Down To: Not on file    Threaten Physical Harm: Not on file    Scream or Curse: Not on file      Review of Systems  Constitutional:  Positive for fatigue and unexpected weight change. Negative for activity change, appetite change, chills and fever.  HENT: Negative.  Negative for congestion, ear pain, rhinorrhea, sore throat and trouble swallowing.        Sensitivity to sound associated with migraines  Eyes:  Positive for photophobia (associated with migraines).  Respiratory: Negative.  Negative for cough, chest tightness, shortness of breath and wheezing.   Cardiovascular: Negative.  Negative for chest pain and palpitations.  Gastrointestinal:  Positive for nausea (associated with migraines). Negative for abdominal pain, blood in stool, constipation, diarrhea and vomiting.  Endocrine: Negative.   Genitourinary: Negative.  Negative for difficulty urinating, dysuria, frequency, hematuria and urgency.  Musculoskeletal:  Positive for arthralgias. Negative for back pain, joint swelling, myalgias and neck pain.  Skin: Negative.  Negative for rash and wound.  Allergic/Immunologic: Negative.  Negative for immunocompromised state.  Neurological:  Positive for dizziness (associated with migraines) and headaches. Negative for seizures and numbness.  Hematological: Negative.   Psychiatric/Behavioral:  Positive for sleep disturbance. Negative for behavioral problems, self-injury  and suicidal ideas. The patient is nervous/anxious.     Vital Signs: BP 134/86   Pulse 79   Temp 98.5 F (36.9 C)   Resp 16   Ht 5' 5 (1.651 m)   Wt 201 lb 9.6 oz (91.4 kg)   LMP  (LMP Unknown)   SpO2 96%   BMI 33.55 kg/m    Physical Exam Vitals reviewed.  Constitutional:      General: She is not in acute distress.    Appearance: Normal appearance. She is obese. She is not ill-appearing.  HENT:     Head: Normocephalic and atraumatic.  Eyes:     Pupils: Pupils are equal, round, and reactive to light.  Cardiovascular:     Rate and Rhythm: Normal rate and regular rhythm.  Pulmonary:     Effort: Pulmonary effort is normal. No respiratory distress.  Neurological:     Mental Status: She is alert and oriented to person, place, and time.  Psychiatric:  Mood and Affect: Mood normal.        Behavior: Behavior normal.        Assessment/Plan: 1. Essential hypertension (Primary) Stable, continue metoprolol  as prescribed.  - metoprolol  succinate (TOPROL -XL) 25 MG 24 hr tablet; TAKE 1 TABLET(25 MG) BY MOUTH DAILY  Dispense: 90 tablet; Refill: 1  2. Intractable chronic migraine with aura and without status migrainosus Retry getting nurtec approved.  - Rimegepant Sulfate (NURTEC) 75 MG TBDP; Take 1 tablet (75 mg total) by mouth every other day.  Dispense: 15 tablet; Refill: 11  3. Class 1 obesity due to excess calories with serious comorbidity and body mass index (BMI) of 33.0 to 33.9 in adult Wegovy  dose increased  - semaglutide -weight management (WEGOVY ) 1.7 MG/0.75ML SOAJ SQ injection; Inject 1.7 mg into the skin once a week.  Dispense: 3 mL; Refill: 5   General Counseling: Amiracle verbalizes understanding of the findings of todays visit and agrees with plan of treatment. I have discussed any further diagnostic evaluation that may be needed or ordered today. We also reviewed her medications today. she has been encouraged to call the office with any questions or concerns  that should arise related to todays visit.    No orders of the defined types were placed in this encounter.   Meds ordered this encounter  Medications   Rimegepant Sulfate (NURTEC) 75 MG TBDP    Sig: Take 1 tablet (75 mg total) by mouth every other day.    Dispense:  15 tablet    Refill:  11    Discontinue qulipta  and start nurtec, please send PA request asap to fax #[phone]   semaglutide -weight management (WEGOVY ) 1.7 MG/0.75ML SOAJ SQ injection    Sig: Inject 1.7 mg into the skin once a week.    Dispense:  3 mL    Refill:  5    Note increased dose, fill new script today. Discontinue 1 mg dose.   metoprolol  succinate (TOPROL -XL) 25 MG 24 hr tablet    Sig: TAKE 1 TABLET(25 MG) BY MOUTH DAILY    Dispense:  90 tablet    Refill:  1    For future refills    Return in about 12 weeks (around 03/26/2024) for F/U, Margarite Vessel PCP, eval new med.   Total time spent:30 Minutes Time spent includes review of chart, medications, test results, and follow up plan with the patient.   [place] Controlled Substance Database was reviewed by me.  This patient was seen by Mardy Maxin, FNP-C in collaboration with Dr. Sigrid Bathe as a part of collaborative care agreement.   Teaghan Melrose R. Maxin, MSN, FNP-C Internal medicine

## 2024-01-05 ENCOUNTER — Encounter: Payer: Self-pay | Admitting: Nurse Practitioner

## 2024-03-26 ENCOUNTER — Ambulatory Visit: Admitting: Nurse Practitioner

## 2024-04-28 ENCOUNTER — Other Ambulatory Visit: Payer: Self-pay | Admitting: Nurse Practitioner

## 2024-04-28 DIAGNOSIS — G43E19 Chronic migraine with aura, intractable, without status migrainosus: Secondary | ICD-10-CM

## 2024-04-30 ENCOUNTER — Other Ambulatory Visit: Payer: Self-pay | Admitting: Nurse Practitioner

## 2024-04-30 DIAGNOSIS — G43E19 Chronic migraine with aura, intractable, without status migrainosus: Secondary | ICD-10-CM
